# Patient Record
Sex: Female | Born: 1985 | Hispanic: Yes | Marital: Single | Smoking: Never smoker
Health system: Southern US, Community
[De-identification: ages and names within clinical notes are randomized; demographics above are authoritative.]

## PROBLEM LIST (undated history)

## (undated) HISTORY — PX: NO PAST SURGERIES: SHX2092

---

## 2017-05-31 DIAGNOSIS — E663 Overweight: Secondary | ICD-10-CM | POA: Insufficient documentation

## 2017-08-04 ENCOUNTER — Other Ambulatory Visit: Payer: Self-pay

## 2017-08-04 ENCOUNTER — Emergency Department: Payer: Self-pay

## 2017-08-04 ENCOUNTER — Encounter: Payer: Self-pay | Admitting: Emergency Medicine

## 2017-08-04 ENCOUNTER — Emergency Department
Admission: EM | Admit: 2017-08-04 | Discharge: 2017-08-05 | Disposition: A | Discharge status: Home / Self Care | Payer: Self-pay | Attending: Emergency Medicine | Admitting: Emergency Medicine

## 2017-08-04 DIAGNOSIS — O9989 Other specified diseases and conditions complicating pregnancy, childbirth and the puerperium: Secondary | ICD-10-CM | POA: Insufficient documentation

## 2017-08-04 DIAGNOSIS — R1030 Lower abdominal pain, unspecified: Secondary | ICD-10-CM | POA: Insufficient documentation

## 2017-08-04 DIAGNOSIS — O209 Hemorrhage in early pregnancy, unspecified: Secondary | ICD-10-CM

## 2017-08-04 DIAGNOSIS — O039 Complete or unspecified spontaneous abortion without complication: Secondary | ICD-10-CM | POA: Insufficient documentation

## 2017-08-04 DIAGNOSIS — Z3A13 13 weeks gestation of pregnancy: Secondary | ICD-10-CM | POA: Insufficient documentation

## 2017-08-04 DIAGNOSIS — O208 Other hemorrhage in early pregnancy: Secondary | ICD-10-CM

## 2017-08-04 DIAGNOSIS — N939 Abnormal uterine and vaginal bleeding, unspecified: Secondary | ICD-10-CM

## 2017-08-04 DIAGNOSIS — M545 Low back pain: Secondary | ICD-10-CM | POA: Insufficient documentation

## 2017-08-04 LAB — URINALYSIS, COMPLETE (UACMP) WITH MICROSCOPIC
BACTERIA UA: NONE SEEN
Bilirubin Urine: NEGATIVE
GLUCOSE, UA: NEGATIVE mg/dL
KETONES UR: NEGATIVE mg/dL
Nitrite: NEGATIVE
PROTEIN: NEGATIVE mg/dL
Specific Gravity, Urine: 1.026 (ref 1.005–1.030)
pH: 6 (ref 5.0–8.0)

## 2017-08-04 LAB — HCG, QUANTITATIVE, PREGNANCY: HCG, BETA CHAIN, QUANT, S: 5900 m[IU]/mL — AB (ref <5)

## 2017-08-04 LAB — CBC
HEMATOCRIT: 37.1 % (ref 35.0–47.0)
HEMOGLOBIN: 13.1 g/dL (ref 12.0–16.0)
MCH: 29.7 pg (ref 26.0–34.0)
MCHC: 35.2 g/dL (ref 32.0–36.0)
MCV: 84.3 fL (ref 80.0–100.0)
Platelets: 280 10*3/uL (ref 150–440)
RBC: 4.4 MIL/uL (ref 3.80–5.20)
RDW: 14.4 % (ref 11.5–14.5)
WBC: 8.7 10*3/uL (ref 3.6–11.0)

## 2017-08-04 LAB — WET PREP, GENITAL
CLUE CELLS WET PREP: NONE SEEN
Sperm: NONE SEEN
TRICH WET PREP: NONE SEEN
YEAST WET PREP: NONE SEEN

## 2017-08-04 LAB — ABO/RH: ABO/RH(D): A POS

## 2017-08-04 NOTE — ED Notes (Signed)
Patient transported to Ultrasound 

## 2017-08-04 NOTE — ED Notes (Signed)
MD at bedside with interpreter activated during examination.

## 2017-08-04 NOTE — ED Triage Notes (Signed)
[redacted] weeks pregnant, began abd pain and vaginal bleed this am.

## 2017-08-04 NOTE — ED Notes (Signed)
Pt to room 8 - interpreter on a stick in available when dr is ready to see pt.

## 2017-08-04 NOTE — ED Notes (Signed)
Patient back from US.

## 2017-08-05 ENCOUNTER — Encounter: Payer: Self-pay | Admitting: Emergency Medicine

## 2017-08-05 LAB — CHLAMYDIA/NGC RT PCR (ARMC ONLY)
Chlamydia Tr: NOT DETECTED
N gonorrhoeae: NOT DETECTED

## 2017-08-05 MED ORDER — ACETAMINOPHEN 325 MG PO TABS
650.0000 mg | ORAL_TABLET | Freq: Once | ORAL | Status: AC
  Administered 2017-08-05: 650 mg via ORAL
  Filled 2017-08-05: qty 2

## 2017-08-05 NOTE — ED Notes (Signed)
D/C and follow-up directions reviewed with pt with aid of tele-interpreter [place] [phone](#[phone])

## 2017-08-05 NOTE — ED Provider Notes (Signed)
[facility] Emergency Department Provider Note   ____________________________________________   First MD Initiated Contact with Patient 08/04/17 2304     (approximate)  I have reviewed the triage vital signs and the nursing notes.   HISTORY  Chief Complaint Vaginal Bleeding and Abdominal Pain    HPI Erica Rhodes is a 32 y.o. female who comes into the hospital today with some abdominal pain and bleeding.  The patient states that she started bleeding this morning.  She is [redacted] weeks pregnant.  She has not yet seen her OB/GYN.  The patient is scheduled to see her OB on Monday.  This is the patient's fifth pregnancy and she has 4 children so she is a G5 P4.  The patient states that she did have some spotting this morning but has continued bleeding throughout the day.  Her abdominal pain is around her waist lower abdomen and low back.  The patient rates her pain an 8 out of 10 in intensity.  The patient did not take anything for pain at home.  She has had no nausea or vomiting at this time.  She is here today for evaluation.  History reviewed. No pertinent past medical history.  There are no active problems to display for this patient.   History reviewed. No pertinent surgical history.  Prior to Admission medications   Not on File    Allergies Patient has no known allergies.  No family history on file.  Social History Social History   Tobacco Use  . Smoking status: Never Smoker  . Smokeless tobacco: Never Used  Substance Use Topics  . Alcohol use: Not Currently  . Drug use: Not on file    Review of Systems  Constitutional: No fever/chills Eyes: No visual changes. ENT: No sore throat. Cardiovascular: Denies chest pain. Respiratory: Denies shortness of breath. Gastrointestinal:  abdominal pain.  No nausea, no vomiting.  No diarrhea.  No constipation. Genitourinary: Vaginal bleeding Musculoskeletal: Negative for back pain. Skin:  Negative for rash. Neurological: Negative for headaches, focal weakness or numbness.   ____________________________________________   PHYSICAL EXAM:  VITAL SIGNS: ED Triage Vitals  Enc Vitals Group     BP 08/04/17 1543 130/75     Pulse Rate 08/04/17 1543 63     Resp 08/04/17 1543 18     Temp 08/04/17 1543 98.7 F (37.1 C)     Temp Source 08/04/17 1543 Oral     SpO2 08/04/17 1543 100 %     Weight 08/04/17 1545 159 lb (72.1 kg)     Height --      Head Circumference --      Peak Flow --      Pain Score 08/04/17 1544 7     Pain Loc --      Pain Edu? --      Excl. in GC? --     Constitutional: Alert and oriented. Well appearing and in moderate distress. Eyes: Conjunctivae are normal. PERRL. EOMI. Head: Atraumatic. Nose: No congestion/rhinnorhea. Mouth/Throat: Mucous membranes are moist.  Oropharynx non-erythematous. Cardiovascular: Normal rate, regular rhythm. Grossly normal heart sounds.  Good peripheral circulation. Respiratory: Normal respiratory effort.  No retractions. Lungs CTAB. Gastrointestinal: Soft with some tenderness to palpation in the lower abdomen. No distention.  Positive bowel sounds Genitourinary: Normal external genitalia with no blood in the vaginal vault mild uterine tenderness to palpation, cervix is closed, no cervical motion tenderness, no adnexal tenderness to palpation Musculoskeletal: No lower extremity tenderness nor edema.  Neurologic:  Normal speech and language.  Skin:  Skin is warm, dry and intact.  Psychiatric: Mood and affect are normal.   ____________________________________________   LABS (all labs ordered are listed, but only abnormal results are displayed)  Labs Reviewed  WET PREP, GENITAL - Abnormal; Notable for the following components:      Result Value   WBC, Wet Prep HPF POC MANY (*)    All other components within normal limits  URINALYSIS, COMPLETE (UACMP) WITH MICROSCOPIC - Abnormal; Notable for the following components:    Color, Urine YELLOW (*)    APPearance HAZY (*)    Hgb urine dipstick SMALL (*)    Leukocytes, UA TRACE (*)    Squamous Epithelial / LPF 6-30 (*)    All other components within normal limits  HCG, QUANTITATIVE, PREGNANCY - Abnormal; Notable for the following components:   hCG, Beta Chain, Quant, S 5,900 (*)    All other components within normal limits  CHLAMYDIA/NGC RT PCR ([facility] ONLY)  CBC  ABO/RH   ____________________________________________  EKG  none ____________________________________________  RADIOLOGY  ED MD interpretation:  US OB less than 14 weeks: Intrauterine gestational sac with no fetal pole concerning for failed pregnancy  Official radiology report(s): [place] Ob Comp Less 14 Wks  Result Date: 08/04/2017 CLINICAL DATA:  Pregnant patient in first-trimester pregnancy with vaginal bleeding. Beta HCG 5,900 EXAM: OBSTETRIC <14 WK US AND TRANSVAGINAL OB US TECHNIQUE: Both transabdominal and transvaginal ultrasound examinations were performed for complete evaluation of the gestation as well as the maternal uterus, adnexal regions, and pelvic cul-de-sac. Transvaginal technique was performed to assess early pregnancy. COMPARISON:  None. FINDINGS: Intrauterine gestational sac: Single Yolk sac:  Not Visualized. Embryo:  Not Visualized. Cardiac Activity: Not Visualized. MSD: 27 mm   7 w   5 d Subchorionic hemorrhage:  None visualized. Maternal uterus/adnexae: Intrauterine gestational sac contains a thin internal ovoid structure that measures 12 mm, no fetal pole. Both ovaries are visualized and are normal. No adnexal mass or pelvic free fluid. IMPRESSION: Intrauterine gestational sac with mean sac diameter of 27 mm. No fetal pole. Mean sac diameter of greater than 25 mm meets definitive criteria for failed pregnancy. This follows SRU consensus guidelines: Diagnostic Criteria for Nonviable Pregnancy Early in the First Trimester. Macy Mis Engl J Med [phone];369:1443-51. Electronically Signed    By: Rubye OaksMelanie  Ehinger M.D.   On: 08/04/2017 21:30   [place] Ob Transvaginal  Result Date: 08/04/2017 CLINICAL DATA:  Pregnant patient in first-trimester pregnancy with vaginal bleeding. Beta HCG 5,900 EXAM: OBSTETRIC <14 WK US AND TRANSVAGINAL OB US TECHNIQUE: Both transabdominal and transvaginal ultrasound examinations were performed for complete evaluation of the gestation as well as the maternal uterus, adnexal regions, and pelvic cul-de-sac. Transvaginal technique was performed to assess early pregnancy. COMPARISON:  None. FINDINGS: Intrauterine gestational sac: Single Yolk sac:  Not Visualized. Embryo:  Not Visualized. Cardiac Activity: Not Visualized. MSD: 27 mm   7 w   5 d Subchorionic hemorrhage:  None visualized. Maternal uterus/adnexae: Intrauterine gestational sac contains a thin internal ovoid structure that measures 12 mm, no fetal pole. Both ovaries are visualized and are normal. No adnexal mass or pelvic free fluid. IMPRESSION: Intrauterine gestational sac with mean sac diameter of 27 mm. No fetal pole. Mean sac diameter of greater than 25 mm meets definitive criteria for failed pregnancy. This follows SRU consensus guidelines: Diagnostic Criteria for Nonviable Pregnancy Early in the First Trimester. Macy Mis Engl J Med [phone];369:1443-51. Electronically Signed   By: Shawna OrleansMelanie  Ehinger M.D.   On: 08/04/2017 21:30    ____________________________________________   PROCEDURES  Procedure(s) performed: None  Procedures  Critical Care performed: No  ____________________________________________   INITIAL IMPRESSION / ASSESSMENT AND PLAN / ED COURSE  As part of my medical decision making, I reviewed the following data within the electronic MEDICAL RECORD NUMBER Notes from prior ED visits and [place] Controlled Substance Database   This is a 32 year old female who comes into the hospital today with some lower abdominal pain and vaginal bleeding at 13 weeks of pregnancy.  My differential diagnosis includes  miscarriage, subchorionic hemorrhage, ectopic pregnancy.  The patient did receive some blood work to include a CBC, quantitative beta hCG, ABO Rh, GC chlamydia and wet prep.  The patient's wet prep and chlamydia are negative.  The patient's quantitative beta hCG is 5900.  Her blood type is A+.  The patient's ultrasound showed an intrauterine gestational sac but there was no fetal pole with a concern for a failed pregnancy.  I discussed the results with the patient.  She did receive a dose of Tylenol for her pain.  The patient will be discharged home to follow-up with her OB/GYN as she has scheduled on Monday.  The patient has no other concerns at this time.      ____________________________________________   FINAL CLINICAL IMPRESSION(S) / ED DIAGNOSES  Final diagnoses:  Miscarriage  Vaginal bleeding     ED Discharge Orders    None       Note:  This document was prepared using Dragon voice recognition software and may include unintentional dictation errors.    Rebecka Apley, MD 08/05/17 [phone]

## 2017-08-08 ENCOUNTER — Encounter: Payer: Self-pay | Admitting: Obstetrics and Gynecology

## 2017-08-10 ENCOUNTER — Encounter: Payer: Self-pay | Admitting: Obstetrics and Gynecology

## 2017-08-10 ENCOUNTER — Ambulatory Visit (INDEPENDENT_AMBULATORY_CARE_PROVIDER_SITE_OTHER): Payer: Self-pay | Admitting: Obstetrics and Gynecology

## 2017-08-10 VITALS — BP 118/74 | Wt 158.0 lb

## 2017-08-10 DIAGNOSIS — O039 Complete or unspecified spontaneous abortion without complication: Secondary | ICD-10-CM

## 2017-08-10 NOTE — Progress Notes (Signed)
Obstetrics & Gynecology Office Visit   Chief Complaint  Patient presents with  . Follow-up    ER Follow Up   History of Present Illness: 32 y.o. she presents in follow up from an ER visit two days ago.  She knew that she was pregnant, but was concerned that she was bleeding. She was told that she was having a miscarriage.  She had a pelvic u/s that showed a gestational sac, no fetal pole. The gestational sac measured 27 mm.  Her quant hCG was 5,900. Her blood type is A+.  She has not stopped bleeding since Saturday. She has been passing large clots. She is using 4-5 pads per day.  She has cramping and pain.  Her last period was 05/02/2017.  Her menses come every month, lasting 1 week.  She went to the clinic in January to get a Depo Provera injection she had taken a home pregnancy test which was positive. However, in January, prior to receiving her Depo shot, she was told she was not pregnant. She was not on contraception in December.  Past Medical History: denies  Past Surgical History: denies   Gynecologic History: No LMP recorded. Patient is pregnant.  Obstetric History: [id number] G1 girl 2006 SVD G2 boy 2008 SVD G3 boy 2014 SVD G4 girl 2018 SVD G5 present  Family History: Denies history of gyn cancer    Social History   Socioeconomic History  . Marital status: Single    Spouse name: Not on file  . Number of children: Not on file  . Years of education: Not on file  . Highest education level: Not on file  Occupational History  . Not on file  Social Needs  . Financial resource strain: Not on file  . Food insecurity:    Worry: Not on file    Inability: Not on file  . Transportation needs:    Medical: Not on file    Non-medical: Not on file  Tobacco Use  . Smoking status: Never Smoker  . Smokeless tobacco: Never Used  Substance and Sexual Activity  . Alcohol use: Not Currently  . Drug use: Not on file  . Sexual activity: Not on file  Lifestyle  . Physical activity:      Days per week: Not on file    Minutes per session: Not on file  . Stress: Not on file  Relationships  . Social connections:    Talks on phone: Not on file    Gets together: Not on file    Attends religious service: Not on file    Active member of club or organization: Not on file    Attends meetings of clubs or organizations: Not on file    Relationship status: Not on file  . Intimate partner violence:    Fear of current or ex partner: Not on file    Emotionally abused: Not on file    Physically abused: Not on file    Forced sexual activity: Not on file  Other Topics Concern  . Not on file  Social History Narrative  . Not on file   Allergies: No Known Allergies  Prior to Admission medications   Denies   Review of Systems  Constitutional: Negative.   HENT: Negative.   Eyes: Negative.   Respiratory: Negative.   Cardiovascular: Negative.   Gastrointestinal: Positive for abdominal pain (cramping with bleeding). Negative for blood in stool, constipation, diarrhea, heartburn, melena, nausea and vomiting.  Genitourinary: Negative.  See hpi  Musculoskeletal: Negative.   Skin: Negative.   Neurological: Negative.   Psychiatric/Behavioral: Negative.      Physical Exam BP 118/74   Wt 158 lb (71.7 kg)  No LMP recorded. Patient is pregnant. Physical Exam  Constitutional: She is oriented to person, place, and time. She appears well-developed and well-nourished. No distress.  Genitourinary: Vagina normal and uterus normal. Pelvic exam was performed with patient supine. There is no rash, tenderness or lesion on the right labia. There is no rash, tenderness or lesion on the left labia. No bleeding in the vagina. No signs of injury around the vagina. Right adnexum does not display mass, does not display tenderness and does not display fullness. Left adnexum does not display mass, does not display tenderness and does not display fullness. Cervix does not exhibit motion tenderness,  lesion or polyp.   Uterus is mobile. Uterus is not enlarged, tender, exhibiting a mass or irregular (is regular).  Genitourinary Comments: Cervical OS is closed. Scant bleeding.  HENT:  Head: Normocephalic and atraumatic.  Eyes: EOM are normal. No scleral icterus.  Neck: Normal range of motion. Neck supple. No thyromegaly present.  Cardiovascular: Normal rate and regular rhythm. Exam reveals no gallop and no friction rub.  No murmur heard. Pulmonary/Chest: Effort normal and breath sounds normal. No respiratory distress. She has no wheezes. She has no rales.  Abdominal: Soft. Bowel sounds are normal. She exhibits no distension and no mass. There is no tenderness. There is no rebound and no guarding.  Musculoskeletal: Normal range of motion. She exhibits no edema.  Lymphadenopathy:    She has no cervical adenopathy.  Neurological: She is alert and oriented to person, place, and time. No cranial nerve deficit.  Skin: Skin is warm and dry. No erythema.  Psychiatric: She has a normal mood and affect. Her behavior is normal. Judgment normal.   Female chaperone present for pelvic and breast  portions of the physical exam  Assessment: 32 y.o. G1P0 female here for  1. Spontaneous abortion      Plan: Problem List Items Addressed This Visit    None    Visit Diagnoses    Spontaneous abortion    -  Primary   Relevant Orders   Beta hCG quant (ref lab)     Condolences were offered to the patient and her family.  I stressed that while emotionally difficult, that this did not occur because of an actions or inactions by the patient.  Somewhere between 10-20% of identified first trimester pregnancies will unfortunately end in miscarriage.  Given this relatively high incidence rate, further diagnostic testing such as chromosome analysis is generally not clinically relevant nor recommended.  Although the chromosomal abnormalities have been implicated at rates as high as 70% in some studies, these are  generally random and do not infer and increased risk of recurrence with subsequent pregnancies.  However, 3 or more consecutive first trimester losses are relatively uncommon, and these patient generally do benefit from additional work up to determine a potential modifiable etiology.   We briefly discussed management options including expectant management, medical management, and surgical management as well as their relative success rates and complications. Approximately 80% of first trimester miscarriages will pass successfully but may require a time frame of up to 8 weeks (ACOG Practice Bulletin 150 May 2015 "Early Pregnancy Loss").  Medical management using of misoprostil administered every 3-hrs as needed for up to 3 doses speeds up the time frame to completion significantly,  has literature supporting its use up to 63 days or 1835w0d gestation and results in a passage rate of 84-85% Environmental education officer(ACOG Practice Bulletin 143 March 2014 "Medical Management of First-Trimester Abortion").  Dilation and curettage has the highest rate of uterine evacuation, but carries with is operative cost, surgical and anesthetic risk.  While these risk are relatively small they nevertheless include infection, bleeding, uterine perforation, formation of uterine synechia, and in rare cases death.   We discussed repeat ultrasound and or trending HCG levels if the patient wishes to pursue these prior to making her decision.  Clinically I am confident of the diagnosis, but I do not want any doubts in the patient's mind regarding the plan of management she chooses to adopt.  I will allow the patient and her family to discuss management options and she was advised to contact the office to arrange final disposition one she has made her decision or should she have any follow up questions for myself.    The patient is Rh + and rhogam is therefore not indicated.   Will start by trending her hCG values. If rising, will repeat pelvic ultrasound. If  decreasing, will follow.  Discussed that too much bleeding is bleeding > 2 pads per hour for > 2 hours.  She voiced understanding of this.  30 minutes spent in face to face discussion with > 50% spent in counseling,management, and coordination of care of her spontaneous abortion.  A [facility] provided medical spanish interpreter was present throughout the entire visit.  Thomasene MohairStephen Makisha Marrin, MD 08/10/2017 5:21 PM

## 2017-08-11 LAB — BETA HCG QUANT (REF LAB): HCG QUANT: 277 m[IU]/mL

## 2017-08-14 ENCOUNTER — Telehealth: Payer: Self-pay | Admitting: Obstetrics and Gynecology

## 2017-08-14 NOTE — Telephone Encounter (Signed)
Patient is calling for labs results. Please advise. Pt needs an interpreter

## 2017-08-16 ENCOUNTER — Other Ambulatory Visit: Payer: Self-pay | Admitting: Obstetrics and Gynecology

## 2017-08-16 ENCOUNTER — Other Ambulatory Visit: Payer: Self-pay

## 2017-08-16 DIAGNOSIS — O039 Complete or unspecified spontaneous abortion without complication: Secondary | ICD-10-CM

## 2017-08-16 NOTE — Telephone Encounter (Signed)
Spoke w patient in office, as she stopped by. Discussed significant decrease in HCG. Will have her take home pregnancy tests until negative. She would like a Mirena IUD. Will have her call to schedule when her test is negative.

## 2017-10-24 ENCOUNTER — Other Ambulatory Visit: Payer: Self-pay

## 2017-10-24 ENCOUNTER — Emergency Department
Admission: EM | Admit: 2017-10-24 | Discharge: 2017-10-24 | Disposition: A | Discharge status: Home / Self Care | Payer: Self-pay | Attending: Emergency Medicine | Admitting: Emergency Medicine

## 2017-10-24 ENCOUNTER — Emergency Department: Payer: Self-pay

## 2017-10-24 DIAGNOSIS — R109 Unspecified abdominal pain: Secondary | ICD-10-CM

## 2017-10-24 DIAGNOSIS — R1012 Left upper quadrant pain: Secondary | ICD-10-CM | POA: Insufficient documentation

## 2017-10-24 DIAGNOSIS — R197 Diarrhea, unspecified: Secondary | ICD-10-CM | POA: Insufficient documentation

## 2017-10-24 LAB — URINALYSIS, COMPLETE (UACMP) WITH MICROSCOPIC
Bilirubin Urine: NEGATIVE
GLUCOSE, UA: NEGATIVE mg/dL
Hgb urine dipstick: NEGATIVE
Ketones, ur: 5 mg/dL — AB
Leukocytes, UA: NEGATIVE
Nitrite: NEGATIVE
PH: 5 (ref 5.0–8.0)
PROTEIN: 30 mg/dL — AB
Specific Gravity, Urine: 1.031 — ABNORMAL HIGH (ref 1.005–1.030)

## 2017-10-24 LAB — COMPREHENSIVE METABOLIC PANEL
ALK PHOS: 53 U/L (ref 38–126)
ALT: 28 U/L (ref 14–54)
AST: 43 U/L — ABNORMAL HIGH (ref 15–41)
Albumin: 4.1 g/dL (ref 3.5–5.0)
Anion gap: 10 (ref 5–15)
BUN: 11 mg/dL (ref 6–20)
CALCIUM: 8.5 mg/dL — AB (ref 8.9–10.3)
CO2: 22 mmol/L (ref 22–32)
CREATININE: 0.52 mg/dL (ref 0.44–1.00)
Chloride: 108 mmol/L (ref 101–111)
GFR calc non Af Amer: >60 mL/min (ref >60)
GLUCOSE: 122 mg/dL — AB (ref 65–99)
Potassium: 3.1 mmol/L — ABNORMAL LOW (ref 3.5–5.1)
SODIUM: 140 mmol/L (ref 135–145)
Total Bilirubin: 0.5 mg/dL (ref 0.3–1.2)
Total Protein: 7.6 g/dL (ref 6.5–8.1)

## 2017-10-24 LAB — CBC
HCT: 36.2 % (ref 35.0–47.0)
Hemoglobin: 12.5 g/dL (ref 12.0–16.0)
MCH: 28.4 pg (ref 26.0–34.0)
MCHC: 34.6 g/dL (ref 32.0–36.0)
MCV: 82.1 fL (ref 80.0–100.0)
PLATELETS: 273 10*3/uL (ref 150–440)
RBC: 4.41 MIL/uL (ref 3.80–5.20)
RDW: 14.9 % — ABNORMAL HIGH (ref 11.5–14.5)
WBC: 8.8 10*3/uL (ref 3.6–11.0)

## 2017-10-24 LAB — POCT PREGNANCY, URINE: Preg Test, Ur: NEGATIVE

## 2017-10-24 LAB — LIPASE, BLOOD: Lipase: 47 U/L (ref 11–51)

## 2017-10-24 MED ORDER — DIPHENOXYLATE-ATROPINE 2.5-0.025 MG PO TABS: 1.0000 | ORAL_TABLET | Freq: Four times a day (QID) | ORAL | 0 refills | Status: AC | PRN

## 2017-10-24 NOTE — ED Notes (Signed)
Pt c/o left sided abd pain for the past week , about 6 watery stools/day for the past 4 days. Denies N/V..Marland Kitchen

## 2017-10-24 NOTE — ED Notes (Signed)
Interpreter paged.

## 2017-10-24 NOTE — ED Triage Notes (Signed)
Pt reports that she is having LLQ pain, it has been going on for the last week and each day it is getting worse. Pt reports that she has had diarrhea. Denies N/V

## 2017-10-24 NOTE — ED Notes (Signed)
Waiting on interpreter at this time. Pt is aware.

## 2017-10-24 NOTE — ED Provider Notes (Signed)
[facility] Emergency Department Provider Note ____________________________________________   First MD Initiated Contact with Patient 10/24/17 1621     (approximate)  I have reviewed the triage vital signs and the nursing notes.   HISTORY  Chief Complaint Abdominal Pain  HPI Erica Rhodes is a 32 y.o. female with a history of a miscarriage approximately 3 months ago has been with left-sided intermittent flank pain over the past week.  The pain is been worsening and is sharp and severe and intermittent.  The patient says that the pain comes about every 15 minutes.  Not associated with any nausea or vomiting.  However, the patient says that she has had diarrhea about 6 times over the past 4 days.  No known sick contacts.  No recent antibiotic usage.  Denies any vaginal bleeding or discharge.  Low concern for sexually transmitted disease.  No past medical history on file.  There are no active problems to display for this patient.   Past Surgical History:  Procedure Laterality Date  . NO PAST SURGERIES      Prior to Admission medications   Not on File    Allergies Patient has no known allergies.  No family history on file.  Social History Social History   Tobacco Use  . Smoking status: Never Smoker  . Smokeless tobacco: Never Used  Substance Use Topics  . Alcohol use: Not Currently  . Drug use: Never    Review of Systems  Constitutional: No fever/chills Eyes: No visual changes. ENT: No sore throat. Cardiovascular: Denies chest pain. Respiratory: Denies shortness of breath. Gastrointestinal:   No nausea, no vomiting. No constipation. Genitourinary: Negative for dysuria. Musculoskeletal: Negative for back pain. Skin: Negative for rash. Neurological: Negative for headaches, focal weakness or numbness.   ____________________________________________   PHYSICAL EXAM:  VITAL SIGNS: ED Triage Vitals  Enc Vitals Group     BP  10/24/17 1547 125/62     Pulse Rate 10/24/17 1547 84     Resp 10/24/17 1547 18     Temp 10/24/17 1547 98.3 F (36.8 C)     Temp Source 10/24/17 1547 Oral     SpO2 10/24/17 1547 99 %     Weight 10/24/17 1546 167 lb (75.8 kg)     Height 10/24/17 1546 5\' 4"  (1.626 m)     Head Circumference --      Peak Flow --      Pain Score 10/24/17 1544 10     Pain Loc --      Pain Edu? --      Excl. in GC? --     Constitutional: Alert and oriented. Well appearing and in no acute distress. Eyes: Conjunctivae are normal.  Head: Atraumatic. Nose: No congestion/rhinnorhea. Mouth/Throat: Mucous membranes are moist.  Neck: No stridor.   Cardiovascular: Normal rate, regular rhythm. Grossly normal heart sounds.   Respiratory: Normal respiratory effort.  No retractions. Lungs CTAB. Gastrointestinal: Soft with moderate tenderness palpation in the left upper as well as left lower quadrants.  No rebound or guarding. No distention. No CVA tenderness. Musculoskeletal: No lower extremity tenderness nor edema.  No joint effusions. Neurologic:  Normal speech and language. No gross focal neurologic deficits are appreciated. Skin:  Skin is warm, dry and intact. No rash noted. Psychiatric: Mood and affect are normal. Speech and behavior are normal.  ____________________________________________   LABS (all labs ordered are listed, but only abnormal results are displayed)  Labs Reviewed  COMPREHENSIVE METABOLIC PANEL - Abnormal;  Notable for the following components:      Result Value   Potassium 3.1 (*)    Glucose, Bld 122 (*)    Calcium 8.5 (*)    AST 43 (*)    All other components within normal limits  CBC - Abnormal; Notable for the following components:   RDW 14.9 (*)    All other components within normal limits  URINALYSIS, COMPLETE (UACMP) WITH MICROSCOPIC - Abnormal; Notable for the following components:   Color, Urine YELLOW (*)    APPearance HAZY (*)    Specific Gravity, Urine 1.031 (*)     Ketones, ur 5 (*)    Protein, ur 30 (*)    Bacteria, UA RARE (*)    All other components within normal limits  LIPASE, BLOOD  POC URINE PREG, ED  POCT PREGNANCY, URINE   ____________________________________________  EKG   ____________________________________________  RADIOLOGY  CT abdomen without any acute intra-abdominal process.  Cholelithiasis but patient without any right upper quadrant tenderness to palpation. ____________________________________________   PROCEDURES  Procedure(s) performed:   Procedures  Critical Care performed:   ____________________________________________   INITIAL IMPRESSION / ASSESSMENT AND PLAN / ED COURSE  Pertinent labs & imaging results that were available during my care of the patient were reviewed by me and considered in my medical decision making (see chart for details).  Differential diagnosis includes, but is not limited to, ovarian cyst, ovarian torsion, acute appendicitis, diverticulitis, urinary tract infection/pyelonephritis, endometriosis, bowel obstruction, colitis, renal colic, gastroenteritis, hernia, fibroids, endometriosis, pregnancy related pain including ectopic pregnancy, etc. As part of my medical decision making, I reviewed the following data within the electronic MEDICAL RECORD NUMBER Notes from prior ED visits  ----------------------------------------- 7:44 PM on 10/24/2017 -----------------------------------------  Patient without any distress at this time.  Pain likely secondary to diarrhea.  Informed about gallstones.  Patient will be following up with her primary care.  We will give Lomotil for symptomatic relief. ____________________________________________   FINAL CLINICAL IMPRESSION(S) / ED DIAGNOSES  Abdominal pain.  Diarrhea.    NEW MEDICATIONS STARTED DURING THIS VISIT:  New Prescriptions   No medications on file     Note:  This document was prepared using Dragon voice recognition software and may  include unintentional dictation errors.     Myrna BlazerSchaevitz, David Matthew, MD 10/24/17 1944

## 2017-10-24 NOTE — ED Notes (Signed)
Awaiting interpreter

## 2018-07-10 LAB — HM HIV SCREENING LAB: HM HIV Screening: NEGATIVE

## 2018-12-16 DIAGNOSIS — E663 Overweight: Secondary | ICD-10-CM

## 2018-12-17 ENCOUNTER — Other Ambulatory Visit: Payer: Self-pay

## 2018-12-17 ENCOUNTER — Ambulatory Visit (LOCAL_COMMUNITY_HEALTH_CENTER): Payer: Self-pay

## 2018-12-17 VITALS — BP 119/70 | Ht 62.0 in | Wt 179.5 lb

## 2018-12-17 DIAGNOSIS — Z3042 Encounter for surveillance of injectable contraceptive: Secondary | ICD-10-CM

## 2018-12-17 DIAGNOSIS — Z3009 Encounter for other general counseling and advice on contraception: Secondary | ICD-10-CM

## 2018-12-17 DIAGNOSIS — Z30013 Encounter for initial prescription of injectable contraceptive: Secondary | ICD-10-CM

## 2018-12-17 MED ORDER — MEDROXYPROGESTERONE ACETATE 150 MG/ML IM SUSP
150.0000 mg | Freq: Once | INTRAMUSCULAR | Status: AC
  Administered 2018-12-17: 150 mg via INTRAMUSCULAR

## 2018-12-17 NOTE — Progress Notes (Signed)
Last physical at [facility] 07/10/2018. Last depo at [facility] 09/30/2018; 11.1 weeks post depo. DMPA 150 mg IM today per Antoine Primas, PA order dated 07/10/2018.

## 2019-03-10 ENCOUNTER — Ambulatory Visit: Payer: Self-pay

## 2019-03-18 ENCOUNTER — Ambulatory Visit (LOCAL_COMMUNITY_HEALTH_CENTER): Payer: Self-pay

## 2019-03-18 ENCOUNTER — Other Ambulatory Visit: Payer: Self-pay

## 2019-03-18 VITALS — BP 117/71 | Ht 62.0 in | Wt 187.5 lb

## 2019-03-18 DIAGNOSIS — Z3009 Encounter for other general counseling and advice on contraception: Secondary | ICD-10-CM

## 2019-03-18 DIAGNOSIS — Z30013 Encounter for initial prescription of injectable contraceptive: Secondary | ICD-10-CM

## 2019-03-18 MED ORDER — MULTI-VITAMIN/MINERALS PO TABS: 1.0000 | ORAL_TABLET | Freq: Every day | ORAL | 0 refills | Status: AC

## 2019-03-18 MED ORDER — MEDROXYPROGESTERONE ACETATE 150 MG/ML IM SUSP
150.0000 mg | Freq: Once | INTRAMUSCULAR | Status: AC
  Administered 2019-03-18: 10:00:00 150 mg via INTRAMUSCULAR

## 2019-03-18 MED ORDER — MULTI-VITAMIN/MINERALS PO TABS: 1.0000 | ORAL_TABLET | Freq: Every day | ORAL | 0 refills | Status: DC

## 2019-03-18 NOTE — Progress Notes (Addendum)
Client requested Depo "in right hip". Depo administered per 07/10/2018 written order of Antoine Primas PA and client tolerated without complaint. Rich Number, RN Folic acid counseling completed and vitamins dispensed. Rich Number, RN

## 2019-07-31 ENCOUNTER — Encounter: Payer: Self-pay | Admitting: Family Medicine

## 2019-07-31 ENCOUNTER — Ambulatory Visit (LOCAL_COMMUNITY_HEALTH_CENTER): Payer: Self-pay | Admitting: Family Medicine

## 2019-07-31 ENCOUNTER — Other Ambulatory Visit: Payer: Self-pay

## 2019-07-31 VITALS — BP 109/69 | Ht 62.0 in | Wt 189.4 lb

## 2019-07-31 DIAGNOSIS — Z113 Encounter for screening for infections with a predominantly sexual mode of transmission: Secondary | ICD-10-CM

## 2019-07-31 DIAGNOSIS — Z3009 Encounter for other general counseling and advice on contraception: Secondary | ICD-10-CM

## 2019-07-31 DIAGNOSIS — N912 Amenorrhea, unspecified: Secondary | ICD-10-CM

## 2019-07-31 DIAGNOSIS — Z30013 Encounter for initial prescription of injectable contraceptive: Secondary | ICD-10-CM

## 2019-07-31 DIAGNOSIS — Z01419 Encounter for gynecological examination (general) (routine) without abnormal findings: Secondary | ICD-10-CM

## 2019-07-31 LAB — WET PREP FOR TRICH, YEAST, CLUE
Trichomonas Exam: NEGATIVE
Yeast Exam: NEGATIVE

## 2019-07-31 LAB — PREGNANCY, URINE: Preg Test, Ur: NEGATIVE

## 2019-07-31 MED ORDER — MEDROXYPROGESTERONE ACETATE 150 MG/ML IM SUSP
150.0000 mg | INTRAMUSCULAR | Status: AC
  Administered 2019-07-31 – 2020-05-27 (×4): 150 mg via INTRAMUSCULAR

## 2019-07-31 NOTE — Progress Notes (Signed)
Family Planning Visit- Repeat Yearly Visit  Subjective:  Erica Rhodes is a 34 y.o. being seen today for an well woman visit and to discuss family planning options.    She is currently using Depo-Provera injections for pregnancy prevention. Patient reports she does not if she or her partner wants a pregnancy in the next year. Patient  has Overweight on their problem list.  Chief Complaint  Patient presents with  . Contraception    Patient reports late depo. Last sex was 3d ago and the time prior was > 2 wks. Took UPT at home that was negative.   Patient denies vaginal sx today  Does the patient desire a pregnancy in the next year? ([facility] flowsheet)  See flowsheet for other program required questions.   Body mass index is 34.64 kg/m. - Patient is eligible for diabetes screening based on BMI and age >45?  no HA1C ordered? not applicable  Patient reports 1 of partners in last year. Desires STI screening?  Yes  Does the patient have a current or past history of drug use? No   No components found for: HCV]   Health Maintenance Due  Topic Date Due  . INFLUENZA VACCINE  Never done    Review of Systems  Constitutional: Negative for chills and fever.  Eyes: Negative for blurred vision and double vision.  Respiratory: Negative for cough and shortness of breath.   Cardiovascular: Negative for chest pain and orthopnea.  Gastrointestinal: Negative for nausea and vomiting.  Genitourinary: Negative for dysuria, flank pain and frequency.  Musculoskeletal: Negative for myalgias.  Skin: Negative for rash.  Neurological: Negative for dizziness, tingling, weakness and headaches.  Endo/Heme/Allergies: Does not bruise/bleed easily.  Psychiatric/Behavioral: Negative for depression and suicidal ideas. The patient is not nervous/anxious.     The following portions of the patient's history were reviewed and updated as appropriate: allergies, current medications, past family history, past  medical history, past social history, past surgical history and problem list. Problem list updated.  Objective:   Vitals:   07/31/19 1032  BP: 109/69  Weight: 189 lb 6.4 oz (85.9 kg)  Height: 5\' 2"  (1.575 m)    Physical Exam Constitutional:      Appearance: Normal appearance.  HENT:     Head: Normocephalic and atraumatic.  Pulmonary:     Effort: Pulmonary effort is normal.  Abdominal:     Palpations: Abdomen is soft.  Musculoskeletal:        General: Normal range of motion.  Skin:    General: Skin is warm and dry.  Neurological:     General: No focal deficit present.     Mental Status: She is alert.  Psychiatric:        Mood and Affect: Mood normal.        Behavior: Behavior normal.       Assessment and Plan:  Erica Rhodes is a 34 y.o. female presenting to the [facility] for an initial well woman exam/family planning visit  Contraception counseling: Reviewed all forms of birth control options in the tiered based approach. available including abstinence; over the counter/barrier methods; hormonal contraceptive medication including pill, patch, ring, injection,contraceptive implant; hormonal and nonhormonal IUDs; permanent sterilization options including vasectomy and the various tubal sterilization modalities. Risks, benefits, and typical effectiveness rates were reviewed.  Questions were answered.  Written information was also given to the patient to review.  Patient desires depo, this was prescribed for patient. She will follow up in  93yr for  surveillance.  She was told to call with any further questions, or with any concerns about this method of contraception.  Emphasized use of condoms 100% of the time for STI prevention.  Patient was offered ECP. ECP was not accepted by the patient.    1. Screening examination for venereal disease Patient accepted all screenings including vaginal CT/GC and bloodwork for HIV/RPR.   Treat wet prep per  standing order Discussed time line for [facility] results and that patient will be called with positive results and encouraged patient to call if she had not heard in 2 weeks.  Counseled to return or seek care for continued or worsening symptoms Recommended condom use with all sex  Patient is currently using depo to prevent pregnancy.  - Pregnancy, urine - Chlamydia/Gonorrhea [place] [facility] - HIV [place] [facility] - Syphilis Serology, [place] [facility] - WET PREP FOR [place], YEAST, CLUE  2. Amenorrhea Urine pregnancy tday  3. Encounter for initial prescription of injectable contraceptive- LATE DEPO  Presenting with late depo (19wk). Currently ~3d from unprotected sex. Reviewed risk of pregnancy. Does not not ECP. Patient desire to get depo today and will take home UPT in 2 wks. Patient will need UPT at next depo administration.  - Depo x 1 year prescribed today   Return in about 3 months (around 10/31/2019) for Depo and UPT prior to administration.  No future appointments.  Caren Macadam, MD

## 2019-07-31 NOTE — Progress Notes (Addendum)
Here today for Depo. Last PE here was 07/10/2018. Per [facility] next Pap not due until 2023 (no records.) Last Depo was 03/18/2019 (19.2 weeks.) Requests PT and STD screening including bloodwork. Tawny Hopping, RN

## 2019-10-14 ENCOUNTER — Ambulatory Visit: Payer: Self-pay

## 2019-11-24 ENCOUNTER — Ambulatory Visit: Payer: Self-pay

## 2019-11-27 ENCOUNTER — Other Ambulatory Visit: Payer: Self-pay

## 2019-11-27 ENCOUNTER — Ambulatory Visit (LOCAL_COMMUNITY_HEALTH_CENTER): Payer: Self-pay | Admitting: Physician Assistant

## 2019-11-27 ENCOUNTER — Encounter: Payer: Self-pay | Admitting: Physician Assistant

## 2019-11-27 VITALS — BP 124/80 | Ht 63.0 in | Wt 183.0 lb

## 2019-11-27 DIAGNOSIS — Z30013 Encounter for initial prescription of injectable contraceptive: Secondary | ICD-10-CM

## 2019-11-27 DIAGNOSIS — Z3009 Encounter for other general counseling and advice on contraception: Secondary | ICD-10-CM

## 2019-11-27 LAB — PREGNANCY, URINE: Preg Test, Ur: NEGATIVE

## 2019-11-27 NOTE — Progress Notes (Signed)
UPT negative. Per C. Rolley Sims, ok to give Depo today. Depo given per Dr. Alvester Morin orders for DMPA x 1 year written 07/31/2019. Patient tolerated well, next Depo card given. Patient counseled needs Depo every 11-13 weeks.Burt Knack, RN

## 2019-11-27 NOTE — Progress Notes (Signed)
Patient here for Depo at 17 weeks. Per notes from last visit on 07/31/19, Dr. Alvester Morin gave orders for Depo x 1year and notes state that patient needs urine PT at next Depo appointment. Next PE due 07/2020.Marland KitchenBurt Knack, RN

## 2019-11-30 NOTE — Progress Notes (Signed)
Consulted by RN re:  Patient here at 17 weeks since last Depo and Dr. Alvester Morin order for Depo for 1 year on 07/31/2019.  As long as PT is negative, ok to give Depo per above order and would recommend that patient use condoms as back up for 10-14 days after shot today.

## 2020-02-28 IMAGING — CT CT RENAL STONE PROTOCOL
3 of 4 series · 9 of 46 positions shown, 16 images · non-contrast
Comparison: None.

CLINICAL DATA: Left lower quadrant pain for the past week.

EXAM:
CT ABDOMEN AND PELVIS WITHOUT CONTRAST
TECHNIQUE: Multidetector CT imaging of the abdomen and pelvis was performed
following the standard protocol without IV contrast.

[Series 4: lung bases · axial · 0.80mm/px · z∈[-134,-49]mm · 5 of 27 slices shown, 10 images]
[im 5/27  soft-tissue]
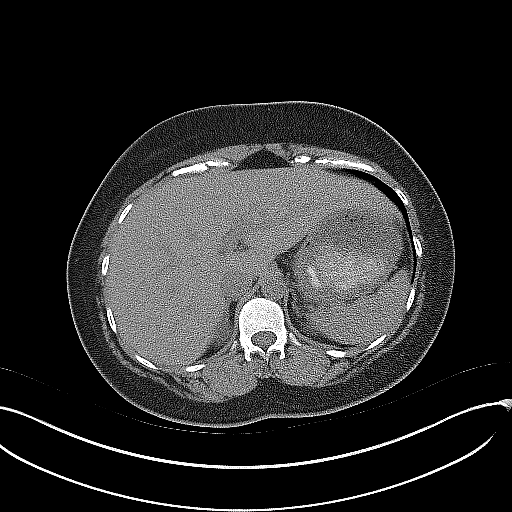
[im 5/27  bone]
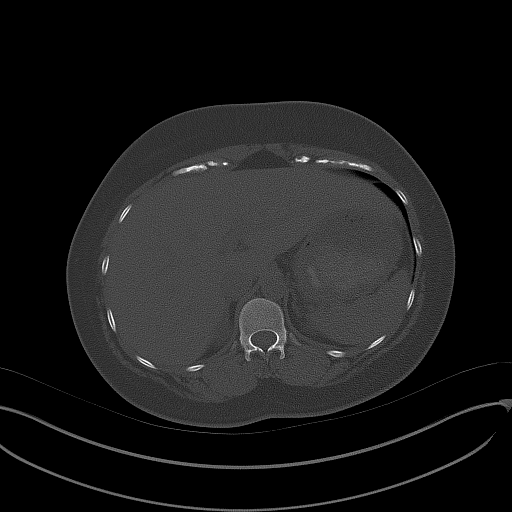
[im 9/27  soft-tissue]
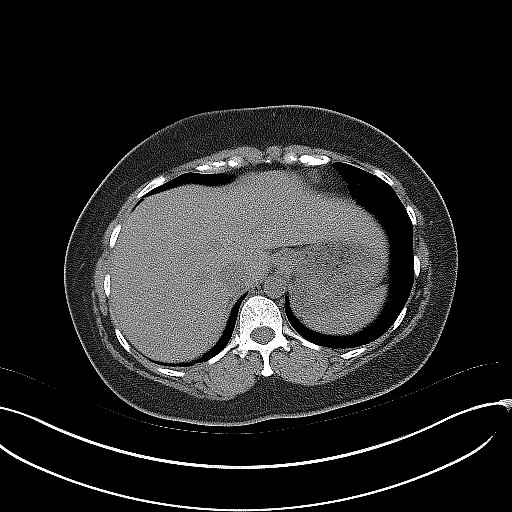
[im 9/27  lung]
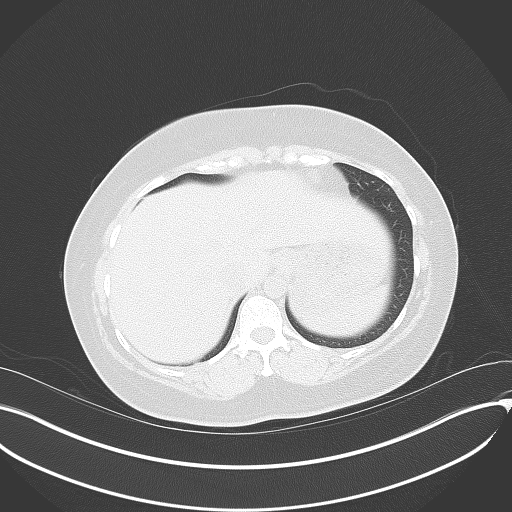
[im 14/27  soft-tissue]
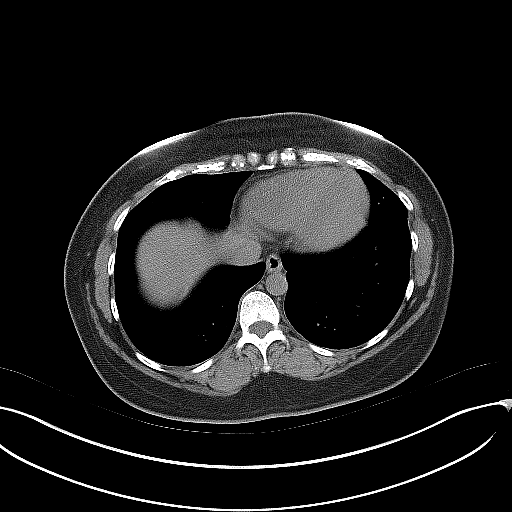
[im 14/27  lung]
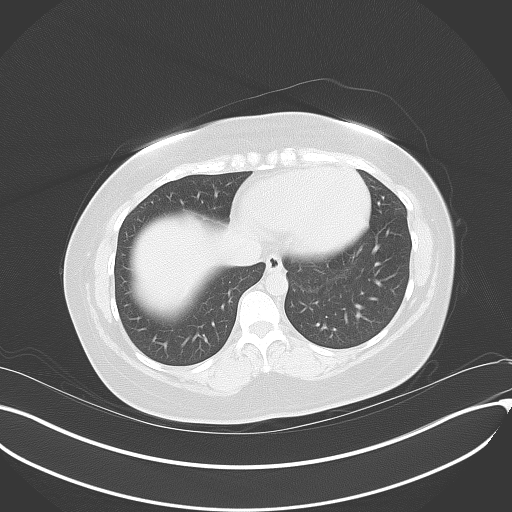
[im 18/27  soft-tissue]
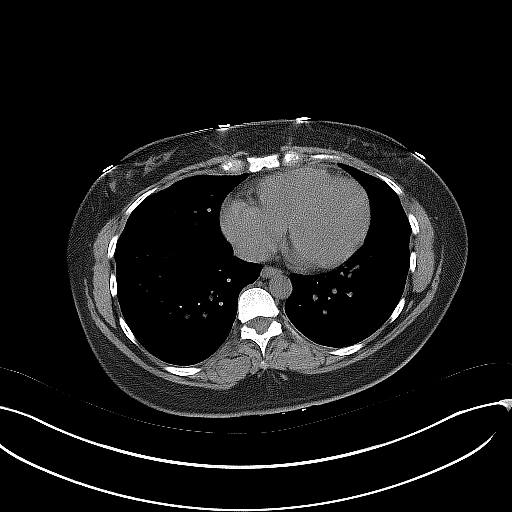
[im 18/27  lung]
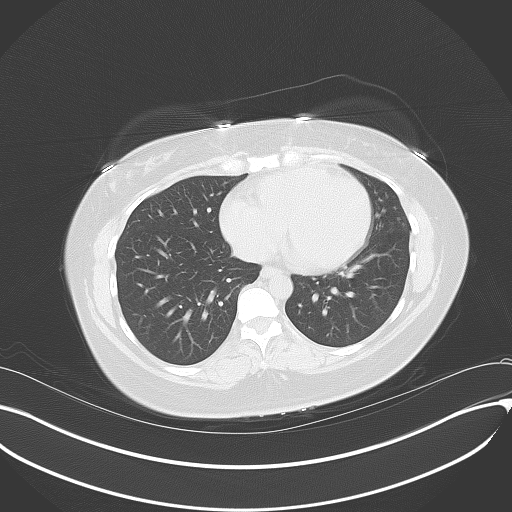
[im 22/27  soft-tissue]
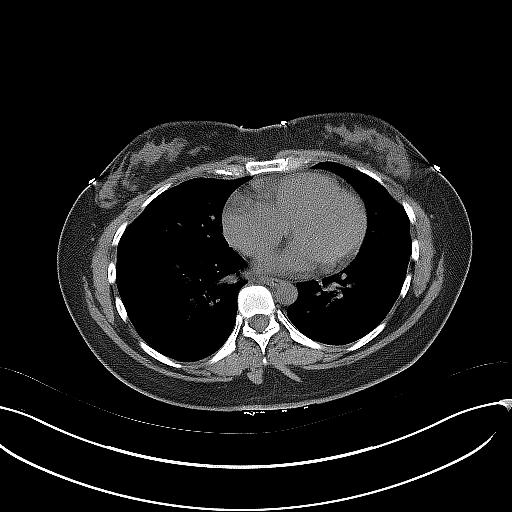
[im 22/27  lung]
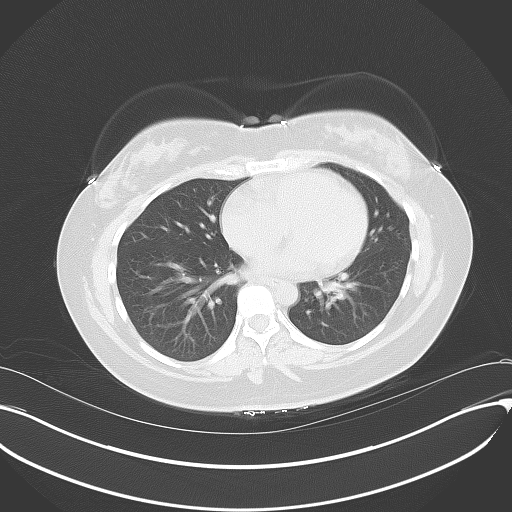

[Series 5: coronal · coronal · 0.82mm/px · 3 of 137 slices shown, 4 images]
[im 46/137  soft-tissue]
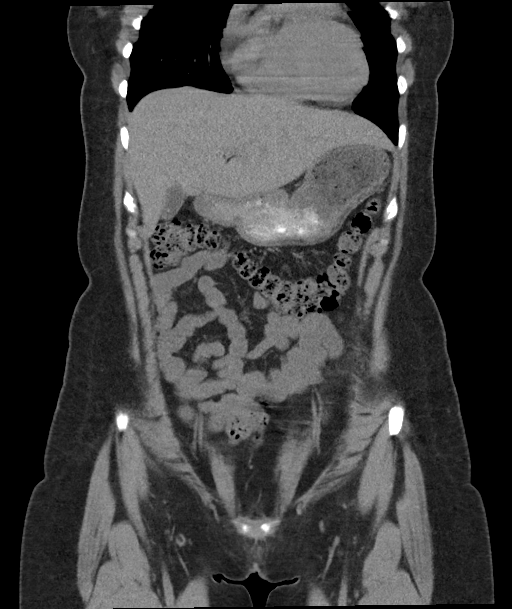
[im 61/137  soft-tissue]
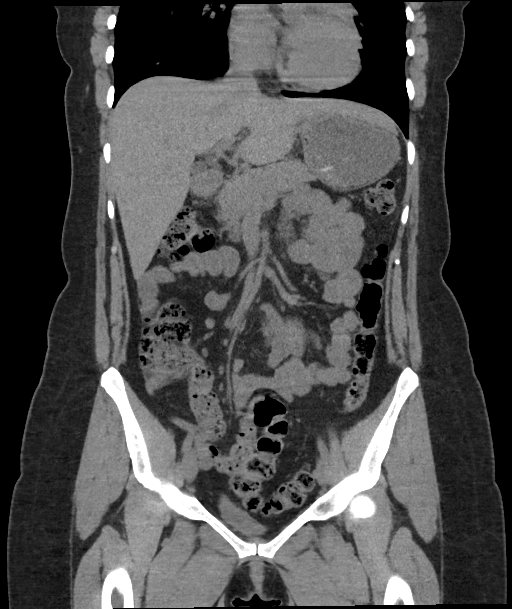
[im 61/137  bone]
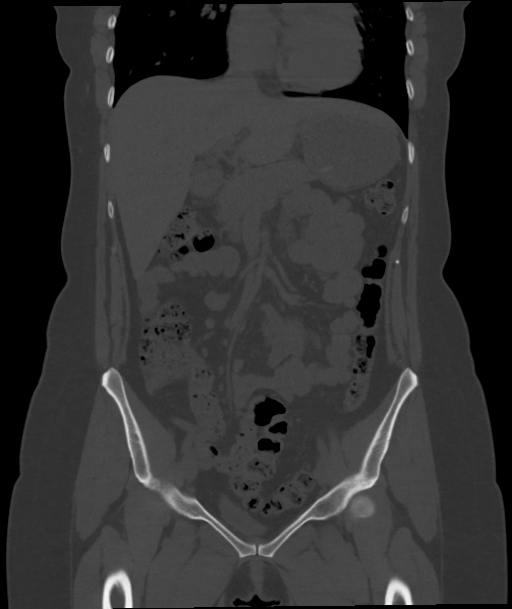
[im 76/137  soft-tissue]
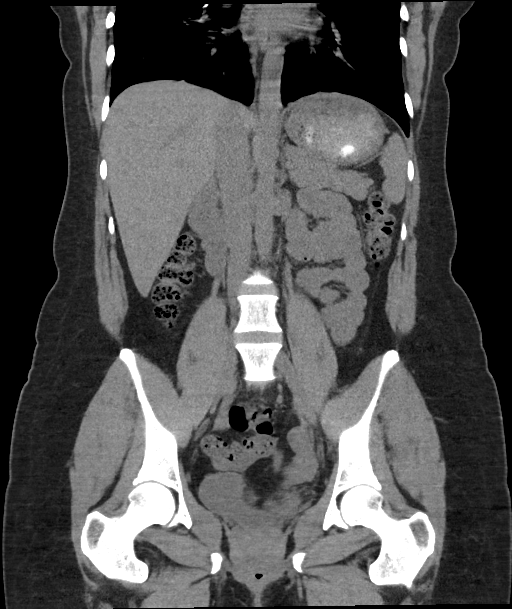

[Series 6: sagittal · sagittal · 0.54mm/px · 1 of 210 slices shown, 2 images]
[im 70/210  soft-tissue]
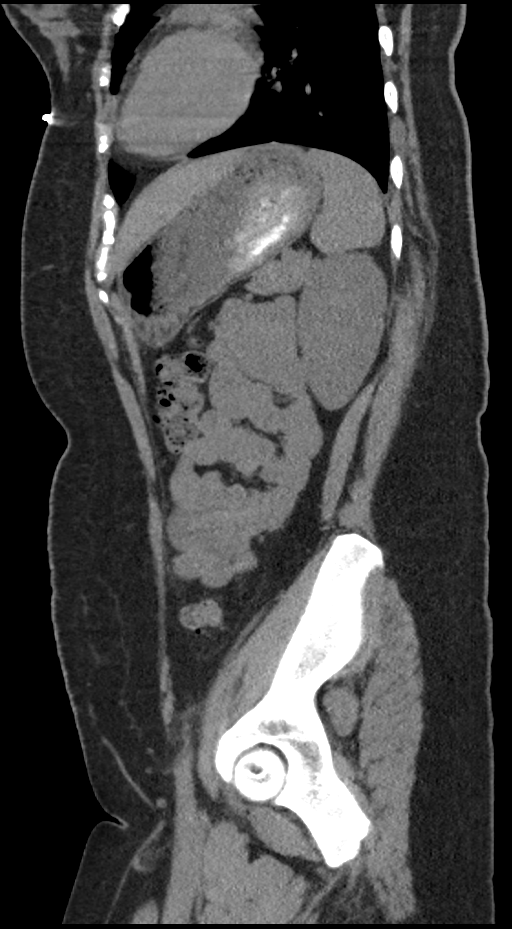
[im 70/210  bone]
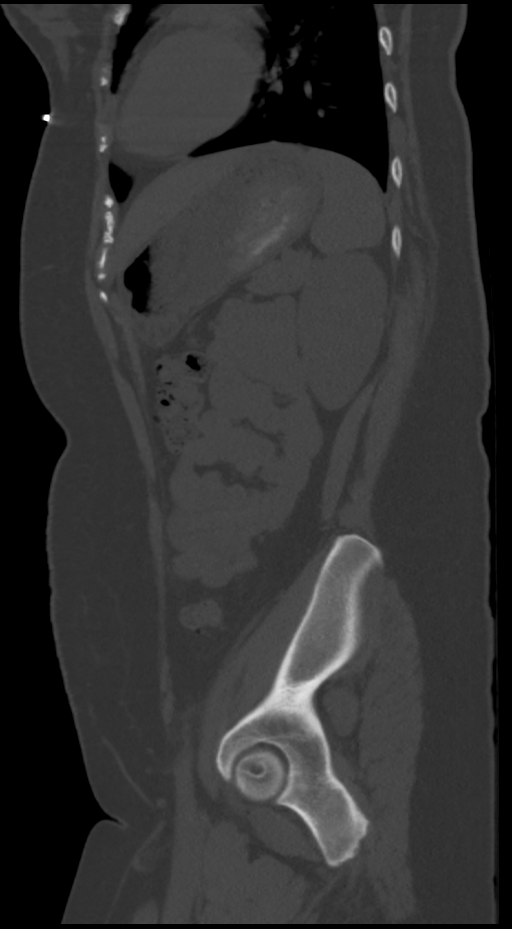

[9 of 46 positions shown; findings below may reference images not displayed]

FINDINGS: Lower chest: No acute abnormality.

Hepatobiliary: No focal liver abnormality. Tiny gallstone. No
gallbladder wall thickening or biliary dilatation.

Pancreas: Unremarkable. No pancreatic ductal dilatation or
surrounding inflammatory changes.

Spleen: Normal in size without focal abnormality.

Adrenals/Urinary Tract: Adrenal glands are unremarkable. Kidneys are
normal, without renal calculi, focal lesion, or hydronephrosis.
Bladder is decompressed.

Stomach/Bowel: Stomach is within normal limits. Appendix appears
normal. No evidence of bowel wall thickening, distention, or
inflammatory changes.

Vascular/Lymphatic: No significant vascular findings are present. No
enlarged abdominal or pelvic lymph nodes.

Reproductive: Uterus and bilateral adnexa are unremarkable. IUD
within the endometrial canal.

Other: Trace free fluid in the pelvis, likely physiologic. No
pneumoperitoneum.

Musculoskeletal: No acute or significant osseous findings.
IMPRESSION: 1.  No acute intra-abdominal process.
2. Cholelithiasis.

## 2020-03-12 ENCOUNTER — Encounter: Payer: Self-pay | Admitting: Advanced Practice Midwife

## 2020-03-12 ENCOUNTER — Other Ambulatory Visit: Payer: Self-pay

## 2020-03-12 ENCOUNTER — Ambulatory Visit (LOCAL_COMMUNITY_HEALTH_CENTER): Payer: Self-pay | Admitting: Advanced Practice Midwife

## 2020-03-12 VITALS — BP 117/69 | Ht 62.0 in | Wt 187.4 lb

## 2020-03-12 DIAGNOSIS — Z3042 Encounter for surveillance of injectable contraceptive: Secondary | ICD-10-CM

## 2020-03-12 DIAGNOSIS — Z3009 Encounter for other general counseling and advice on contraception: Secondary | ICD-10-CM

## 2020-03-12 DIAGNOSIS — Z30013 Encounter for initial prescription of injectable contraceptive: Secondary | ICD-10-CM

## 2020-03-12 NOTE — Progress Notes (Signed)
Patient here today for Depo. Last PE here was 07/31/2019, last Pap Smear was 05/16/2016 with next due 05/16/2021 per Epic care gaps info. Last Depo was 11/27/2019 (15.5 weeks.) Patient without complaints today and wants to continue with Depo. Depo given per 07/31/2019 order of Dr. Alvester Morin. Tawny Hopping, RN

## 2020-05-27 ENCOUNTER — Other Ambulatory Visit: Payer: Self-pay

## 2020-05-27 ENCOUNTER — Ambulatory Visit (LOCAL_COMMUNITY_HEALTH_CENTER): Payer: Self-pay

## 2020-05-27 VITALS — BP 117/69 | Ht 62.0 in | Wt 188.5 lb

## 2020-05-27 DIAGNOSIS — Z3009 Encounter for other general counseling and advice on contraception: Secondary | ICD-10-CM

## 2020-05-27 DIAGNOSIS — Z3042 Encounter for surveillance of injectable contraceptive: Secondary | ICD-10-CM

## 2020-05-27 DIAGNOSIS — Z30013 Encounter for initial prescription of injectable contraceptive: Secondary | ICD-10-CM

## 2020-05-27 DIAGNOSIS — Z01419 Encounter for gynecological examination (general) (routine) without abnormal findings: Secondary | ICD-10-CM

## 2020-05-27 NOTE — Progress Notes (Signed)
10 weeks 6 days post depo. Voices no concerns. Depo 150mg  IM given R UOQ per order by K.  , MD dated 07/31/2019. Tolerated well. Depo and RP due at next  Depo appt (approx 08/12/2020), pt aware. M. 08/14/2020, Interpreter. [place], RN

## 2020-10-28 ENCOUNTER — Ambulatory Visit: Payer: Self-pay

## 2020-11-04 ENCOUNTER — Other Ambulatory Visit: Payer: Self-pay

## 2020-11-04 ENCOUNTER — Ambulatory Visit: Payer: Self-pay

## 2020-11-08 ENCOUNTER — Ambulatory Visit (LOCAL_COMMUNITY_HEALTH_CENTER): Payer: Self-pay | Admitting: Family Medicine

## 2020-11-08 ENCOUNTER — Other Ambulatory Visit: Payer: Self-pay

## 2020-11-08 VITALS — BP 113/74 | Ht 61.5 in | Wt 190.8 lb

## 2020-11-08 DIAGNOSIS — Z3009 Encounter for other general counseling and advice on contraception: Secondary | ICD-10-CM

## 2020-11-08 DIAGNOSIS — Z3200 Encounter for pregnancy test, result unknown: Secondary | ICD-10-CM

## 2020-11-08 DIAGNOSIS — Z113 Encounter for screening for infections with a predominantly sexual mode of transmission: Secondary | ICD-10-CM

## 2020-11-08 DIAGNOSIS — Z30013 Encounter for initial prescription of injectable contraceptive: Secondary | ICD-10-CM

## 2020-11-08 DIAGNOSIS — Z0000 Encounter for general adult medical examination without abnormal findings: Secondary | ICD-10-CM

## 2020-11-08 LAB — WET PREP FOR TRICH, YEAST, CLUE
Trichomonas Exam: NEGATIVE
Yeast Exam: NEGATIVE

## 2020-11-08 LAB — PREGNANCY, URINE: Preg Test, Ur: NEGATIVE

## 2020-11-08 MED ORDER — MEDROXYPROGESTERONE ACETATE 150 MG/ML IM SUSP
150.0000 mg | INTRAMUSCULAR | Status: AC
  Administered 2020-11-08 – 2021-08-01 (×3): 150 mg via INTRAMUSCULAR

## 2020-11-08 NOTE — Progress Notes (Signed)
Family Planning Visit- Repeat Yearly Visit  Subjective:  Erica Rhodes is a 35 y.o. [id number]  being seen today for an annual wellness visit and to discuss contraception options.   The patient is currently using None for pregnancy prevention. Patient does not want a pregnancy in the next year. Patient has the following medical problems: has Overweight on their problem list.  Chief Complaint  Patient presents with   Contraception    Patient reports her for physical,  depo and STI testing   Patient denies and s/sx of infections   See flowsheet for other program required questions.   Body mass index is 35.47 kg/m. - Patient is eligible for diabetes screening based on BMI and age >42?  not applicable HA1C ordered? not applicable  Patient reports 1 of partners in last year. Desires STI screening?  Yes   Has patient been screened once for HCV in the past?  No  No results found for: HCVAB  Does the patient have current of drug use, have a partner with drug use, and/or has been incarcerated since last result? No  If yes-- Screen for HCV through [facility]   Does the patient meet criteria for HBV testing? No  Criteria:  -Household, sexual or needle sharing contact with HBV -History of drug use -HIV positive -Those with known Hep C   Health Maintenance Due  Topic Date Due   COVID-19 Vaccine (1) Never done   Hepatitis C Screening  Never done    Review of Systems  Constitutional:  Negative for chills, fever, malaise/fatigue and weight loss.  HENT:  Negative for congestion, hearing loss and sore throat.   Eyes:  Negative for blurred vision, double vision and photophobia.  Respiratory:  Negative for shortness of breath.   Cardiovascular:  Negative for chest pain.  Gastrointestinal:  Negative for abdominal pain, blood in stool, constipation, diarrhea, heartburn, nausea and vomiting.  Genitourinary:  Negative for dysuria and frequency.  Musculoskeletal:  Negative for  back pain, joint pain and neck pain.  Skin:  Negative for itching and rash.  Neurological:  Negative for dizziness, weakness and headaches.  Endo/Heme/Allergies:  Does not bruise/bleed easily.  Psychiatric/Behavioral:  Negative for depression, substance abuse and suicidal ideas.    The following portions of the patient's history were reviewed and updated as appropriate: allergies, current medications, past family history, past medical history, past social history, past surgical history and problem list. Problem list updated.  Objective:   Vitals:   11/08/20 1315  BP: 113/74  Weight: 190 lb 12.8 oz (86.5 kg)  Height: 5' 1.5" (1.562 m)    Physical Exam Vitals and nursing note reviewed.  Constitutional:      Appearance: Normal appearance.  HENT:     Head: Normocephalic and atraumatic.     Mouth/Throat:     Mouth: Mucous membranes are moist.     Pharynx: No oropharyngeal exudate or posterior oropharyngeal erythema.  Eyes:     General: No scleral icterus. Cardiovascular:     Rate and Rhythm: Normal rate and regular rhythm.     Pulses: Normal pulses.     Heart sounds: Normal heart sounds.  Pulmonary:     Effort: Pulmonary effort is normal.     Breath sounds: Normal breath sounds.  Chest:     Comments: Breasts:        Right: Normal. No swelling, mass, nipple discharge, skin change or tenderness.        Left: Normal. No swelling, mass, nipple  discharge, skin change or tenderness.   Abdominal:     General: Abdomen is flat. Bowel sounds are normal.     Palpations: Abdomen is soft.  Genitourinary:    Comments: Deferred - patient self collected specimens  Musculoskeletal:        General: Normal range of motion.     Cervical back: Normal range of motion and neck supple.  Skin:    General: Skin is warm and dry.  Neurological:     General: No focal deficit present.     Mental Status: She is alert and oriented to person, place, and time.  Psychiatric:        Behavior: Behavior  normal.      Assessment and Plan:  [name] is a 35 y.o. female [id number] presenting to the [facility] [place] [facility] for an yearly wellness and contraception visit    1. Routine general medical examination at a health care facility [facility] woman exam Pap not due until 05/16/2021 CBE completed today.    2. Family planning services Contraception counseling: Reviewed all forms of birth control options in the tiered based approach. available including abstinence; over the counter/barrier methods; hormonal contraceptive medication including pill, patch, ring, injection,contraceptive implant, ECP; hormonal and nonhormonal IUDs; permanent sterilization options including vasectomy and the various tubal sterilization modalities. Risks, benefits, and typical effectiveness rates were reviewed.  Questions were answered.  Written information was also given to the patient to review.  Patient desires Depo Prvera , this was prescribed for patient. She will follow up in  11-13 weeks  for surveillance.  She was told to call with any further questions, or with any concerns about this method of contraception.  Emphasized use of condoms 100% of the time for STI prevention.  Patient was not offered ECP based on when last sex was.   3. Screening examination for venereal disease  Pt desires screening, denies s/sx.  - Chlamydia/Gonorrhea [place] [facility] - HIV [employer] [facility] - Syphilis Serology, [place] [facility] - WET PREP FOR [place], YEAST, CLUE  4. Encounter for pregnancy test, result unknown LMP was 4 years ago.   - Pregnancy, urine  5. Encounter for initial prescription of injectable contraceptive RX Depo Provera 150 mg IM every 11-13 weeks x 1 yr.    - medroxyPROGESTERone (DEPO-PROVERA) injection 150 mg   Return in about 3 months (around 02/08/2021) for depo.  No future appointments.   Juliene Pina used for Spanish interpretation.      Wendi Snipes, FNP

## 2020-11-08 NOTE — Progress Notes (Addendum)
35 year old female in clinic today for a PE and depo restart. Patient is 23 wks post last depo.  Glenna Fellows, RN   Patient PT negative.  Wet mount reviewed, no treatment needed.  Next depo due 01/24/2021.  Depo given per 11/08/20 order Wendi Snipes, FNP. Glenna Fellows, RN

## 2020-11-10 LAB — HM HIV SCREENING LAB: HM HIV Screening: NEGATIVE

## 2020-11-11 ENCOUNTER — Telehealth: Payer: Self-pay

## 2020-11-12 ENCOUNTER — Ambulatory Visit: Payer: Self-pay

## 2020-11-12 ENCOUNTER — Other Ambulatory Visit: Payer: Self-pay

## 2020-11-12 DIAGNOSIS — A749 Chlamydial infection, unspecified: Secondary | ICD-10-CM

## 2020-11-12 MED ORDER — AZITHROMYCIN 500 MG PO TABS
1000.0000 mg | ORAL_TABLET | Freq: Once | ORAL | Status: AC
Start: 2020-11-12 — End: 2020-11-12
  Administered 2020-11-12: 1000 mg via ORAL

## 2020-11-12 NOTE — Telephone Encounter (Signed)
PC to pt with spanish interpreter [place]. Advised pt of positive chlamydia test results. Recommended treatment and counseled pt. Appt made for 7/1 at 830am. Pt verbalized understanding.

## 2020-11-12 NOTE — Telephone Encounter (Signed)
PC to pt with spanish interpreter

## 2020-11-12 NOTE — Progress Notes (Signed)
In nurse clinic for chlamydia treatment. Received first depo injection 11/08/2020. Treated today with Azithromycin 1 gm po DOT per SO Dr Ralene Bathe. Advised to eat after appt and to contact [facility] if vomits within 2 hr of taking med. Questions answered and reports understanding. M. [place], interpreter. Jerel Shepherd, RN

## 2021-01-24 ENCOUNTER — Other Ambulatory Visit: Payer: Self-pay

## 2021-01-24 ENCOUNTER — Ambulatory Visit (LOCAL_COMMUNITY_HEALTH_CENTER): Payer: Self-pay

## 2021-01-24 VITALS — BP 122/78 | Ht 61.5 in | Wt 188.0 lb

## 2021-01-24 DIAGNOSIS — Z3042 Encounter for surveillance of injectable contraceptive: Secondary | ICD-10-CM

## 2021-01-24 DIAGNOSIS — Z30013 Encounter for initial prescription of injectable contraceptive: Secondary | ICD-10-CM

## 2021-01-24 DIAGNOSIS — Z3009 Encounter for other general counseling and advice on contraception: Secondary | ICD-10-CM

## 2021-01-24 NOTE — Progress Notes (Signed)
DMPA 150 mg given IM (RUQ) today per 11/08/20 order by Wendi Snipes, NP and tolerated well.  Pateint is 11 weeks post last Depo. Hart Carwin, RN

## 2021-08-01 ENCOUNTER — Ambulatory Visit (LOCAL_COMMUNITY_HEALTH_CENTER): Payer: Self-pay | Admitting: Nurse Practitioner

## 2021-08-01 ENCOUNTER — Other Ambulatory Visit: Payer: Self-pay

## 2021-08-01 VITALS — BP 116/75 | Ht 62.6 in | Wt 178.0 lb

## 2021-08-01 DIAGNOSIS — Z309 Encounter for contraceptive management, unspecified: Secondary | ICD-10-CM

## 2021-08-01 DIAGNOSIS — Z3202 Encounter for pregnancy test, result negative: Secondary | ICD-10-CM

## 2021-08-01 DIAGNOSIS — Z3009 Encounter for other general counseling and advice on contraception: Secondary | ICD-10-CM

## 2021-08-01 DIAGNOSIS — Z3042 Encounter for surveillance of injectable contraceptive: Secondary | ICD-10-CM

## 2021-08-01 DIAGNOSIS — Z30013 Encounter for initial prescription of injectable contraceptive: Secondary | ICD-10-CM

## 2021-08-01 LAB — PREGNANCY, URINE: Preg Test, Ur: NEGATIVE

## 2021-08-01 NOTE — Progress Notes (Signed)
? ?[place] problem visit  ?[place] Department ? ?Subjective:  ?Erica Rhodes is a 36 y.o. being seen today for Patient in clinic today to resume Depo.  Last Depo was 01/24/2021 ([redacted]w[redacted]d).   ? ?Chief Complaint  ?Patient presents with  ? Contraception  ?  Depo injection  ? ? ?HPI ? ? ?Does the patient have a current or past history of drug use? No  ? No components found for: HCV] ? ? ?Health Maintenance Due  ?Topic Date Due  ? COVID-19 Vaccine (1) Never done  ? Hepatitis C Screening  Never done  ? INFLUENZA VACCINE  Never done  ? PAP SMEAR-Modifier  05/16/2021  ? ? ?Review of Systems  ?Constitutional:  Negative for chills, fever, malaise/fatigue and weight loss.  ?HENT:  Negative for congestion, hearing loss and sore throat.   ?Eyes:  Negative for blurred vision, double vision and photophobia.  ?Respiratory:  Negative for shortness of breath.   ?Cardiovascular:  Negative for chest pain.  ?Gastrointestinal:  Negative for abdominal pain, blood in stool, constipation, diarrhea, heartburn, nausea and vomiting.  ?Genitourinary:  Negative for dysuria and frequency.  ?Musculoskeletal:  Negative for back pain, joint pain and neck pain.  ?Skin:  Negative for itching and rash.  ?Neurological:  Negative for dizziness, weakness and headaches.  ?Endo/Heme/Allergies:  Does not bruise/bleed easily.  ?Psychiatric/Behavioral:  Negative for depression, substance abuse and suicidal ideas.   ? ?The following portions of the patient's history were reviewed and updated as appropriate: allergies, current medications, past family history, past medical history, past social history, past surgical history and problem list. Problem list updated. ? ? ?See flowsheet for other program required questions. ? ?Objective:  ? ?Vitals:  ? 08/01/21 1027  ?BP: 116/75  ?Weight: 178 lb (80.7 kg)  ?Height: 5' 2.6" (1.59 m)  ? ? ?Physical Exam ?Constitutional:   ?   Appearance: Normal appearance.  ?HENT:  ?   Head:  Normocephalic.  ?   Right Ear: External ear normal.  ?   Left Ear: External ear normal.  ?   Nose: Nose normal.  ?Pulmonary:  ?   Effort: Pulmonary effort is normal.  ?Musculoskeletal:  ?   Cervical back: Full passive range of motion without pain, normal range of motion and neck supple.  ?Skin: ?   General: Skin is warm and dry.  ?Neurological:  ?   Mental Status: She is alert and oriented to person, place, and time.  ?Psychiatric:     ?   Attention and Perception: Attention normal.     ?   Mood and Affect: Mood normal.     ?   Speech: Speech normal.     ?   Behavior: Behavior is cooperative.  ? ? ? ? ?Assessment and Plan:  ?Erica Rhodes is a 36 y.o. female presenting to the [facility] Department for a Women's Health problem visit ? ?1. Family planning counseling ?-Patient in clinic today to resume Depo.  Last Depo was 01/24/2021 ([redacted]w[redacted]d).  No problems not with Depo.  BP today within normal limits.  Last sex two weeks ago with a condom.  Patient sent to lab today for PT, if negative may have Depo today.   ? ?- Pregnancy, urine ? ?2. Surveillance for Depo-Provera contraception ?-PT negative, may have Depo today per 10/2020 order.  Stressed the importance of returning to clinic between 11-13 weeks for Depo injection.   ? ? ?Due to a language barrier  an interpreter [place] was used for the provider portion of the visit.    ? ?Return in about 11 weeks (around 10/17/2021). ? ? ?Gregary Cromer, FNP ? ?

## 2021-08-01 NOTE — Progress Notes (Signed)
Pt here for Depo.  She was notified of negative PT.   ?Depo 150 mg given IM in LUOQ without any complications.  Pt given reminder card to return in 11-13 weeks for next Depo injections.  Windle Guard, RN ? ?

## 2021-11-21 ENCOUNTER — Ambulatory Visit: Payer: Self-pay

## 2021-12-05 ENCOUNTER — Ambulatory Visit (LOCAL_COMMUNITY_HEALTH_CENTER): Payer: Self-pay | Admitting: Nurse Practitioner

## 2021-12-05 ENCOUNTER — Encounter: Payer: Self-pay | Admitting: Nurse Practitioner

## 2021-12-05 VITALS — BP 109/71 | Wt 187.8 lb

## 2021-12-05 DIAGNOSIS — Z3202 Encounter for pregnancy test, result negative: Secondary | ICD-10-CM

## 2021-12-05 DIAGNOSIS — Z3009 Encounter for other general counseling and advice on contraception: Secondary | ICD-10-CM

## 2021-12-05 DIAGNOSIS — Z01419 Encounter for gynecological examination (general) (routine) without abnormal findings: Secondary | ICD-10-CM

## 2021-12-05 DIAGNOSIS — Z3042 Encounter for surveillance of injectable contraceptive: Secondary | ICD-10-CM

## 2021-12-05 DIAGNOSIS — Z30013 Encounter for initial prescription of injectable contraceptive: Secondary | ICD-10-CM

## 2021-12-05 DIAGNOSIS — Z113 Encounter for screening for infections with a predominantly sexual mode of transmission: Secondary | ICD-10-CM

## 2021-12-05 LAB — WET PREP FOR TRICH, YEAST, CLUE
Trichomonas Exam: NEGATIVE
Yeast Exam: NEGATIVE

## 2021-12-05 LAB — HM HIV SCREENING LAB: HM HIV Screening: NEGATIVE

## 2021-12-05 LAB — PREGNANCY, URINE: Preg Test, Ur: NEGATIVE

## 2021-12-05 MED ORDER — MEDROXYPROGESTERONE ACETATE 150 MG/ML IM SUSP
150.0000 mg | INTRAMUSCULAR | Status: AC
  Administered 2021-12-05 – 2022-11-21 (×4): 150 mg via INTRAMUSCULAR

## 2021-12-05 NOTE — Progress Notes (Signed)
Patient here for PE, PAP, PT,STD testing, and depo shot. Condoms given. Wet prep reviewed, no tx per standing orders.

## 2021-12-05 NOTE — Progress Notes (Addendum)
[facility] [address] Main Number: [phone]    Family Planning Visit- Initial Visit  Subjective:  Erica Rhodes is a 36 y.o.  [id number]   being seen today for an initial annual visit and to discuss reproductive life planning.  The patient is currently using Hormonal Injection for pregnancy prevention. Patient reports   does not want a pregnancy in the next year.  Patient currently using Depo as a method.     report they are looking for a method that provides High efficacy at preventing pregnancy  Patient has the following medical conditions has Overweight on their problem list.  Chief Complaint  Patient presents with   Annual Exam    depo    Patient reports to clinic today for a physical, STD screening, and Depo   Body mass index is 33.69 kg/m. - Patient is eligible for diabetes screening based on BMI and age >49?  not applicable HA1C ordered? not applicable  Patient reports 1  partner/s in last year. Desires STI screening?  Yes  Has patient been screened once for HCV in the past?  No  No results found for: "HCVAB"  Does the patient have current drug use (including MJ), have a partner with drug use, and/or has been incarcerated since last result? No  If yes-- Screen for HCV through [facility]   Does the patient meet criteria for HBV testing? No  Criteria:  -Household, sexual or needle sharing contact with HBV -History of drug use -HIV positive -Those with known Hep C   Health Maintenance Due  Topic Date Due   COVID-19 Vaccine (1) Never done   Hepatitis C Screening  Never done   PAP SMEAR-Modifier  05/16/2021    Review of Systems  Constitutional:  Negative for chills, fever, malaise/fatigue and weight loss.  HENT:  Negative for congestion, hearing loss and sore throat.   Eyes:  Negative for blurred vision, double vision and photophobia.  Respiratory:  Negative for shortness of breath.    Cardiovascular:  Negative for chest pain.  Gastrointestinal:  Negative for abdominal pain, blood in stool, constipation, diarrhea, heartburn, nausea and vomiting.  Genitourinary:  Negative for dysuria and frequency.  Musculoskeletal:  Negative for back pain, joint pain and neck pain.  Skin:  Negative for itching and rash.  Neurological:  Negative for dizziness, weakness and headaches.  Endo/Heme/Allergies:  Does not bruise/bleed easily.  Psychiatric/Behavioral:  Negative for depression, substance abuse and suicidal ideas.     The following portions of the patient's history were reviewed and updated as appropriate: allergies, current medications, past family history, past medical history, past social history, past surgical history and problem list. Problem list updated.   See flowsheet for other program required questions.  Objective:   Vitals:   12/05/21 1335  BP: 109/71  Weight: 187 lb 12.8 oz (85.2 kg)    Physical Exam Constitutional:      Appearance: Normal appearance.  HENT:     Head: Normocephalic. No abrasion, masses or laceration. Hair is normal.     Jaw: No tenderness or swelling.     Right Ear: External ear normal.     Left Ear: External ear normal.     Nose: Nose normal.     Mouth/Throat:     Lips: Pink. No lesions.     Mouth: Mucous membranes are moist. No lacerations or oral lesions.     Dentition: No dental caries.  Tongue: No lesions.     Palate: No mass and lesions.     Pharynx: No pharyngeal swelling, oropharyngeal exudate, posterior oropharyngeal erythema or uvula swelling.     Tonsils: No tonsillar exudate or tonsillar abscesses.  Eyes:     Pupils: Pupils are equal, round, and reactive to light.  Neck:     Thyroid: No thyroid mass, thyromegaly or thyroid tenderness.  Cardiovascular:     Rate and Rhythm: Normal rate and regular rhythm.  Pulmonary:     Effort: Pulmonary effort is normal.     Breath sounds: Normal breath sounds.  Abdominal:      General: Abdomen is flat. Bowel sounds are normal.     Palpations: Abdomen is soft.     Tenderness: There is no abdominal tenderness. There is no rebound.  Genitourinary:    Pubic Area: No rash or pubic lice.      Labia:        Right: No rash, tenderness or lesion.        Left: No rash, tenderness or lesion.      Vagina: Normal. No vaginal discharge, erythema, tenderness or lesions.     Cervix: No cervical motion tenderness, discharge, lesion or erythema.     Uterus: Normal.      Adnexa:        Right: No tenderness.         Left: No tenderness.       Rectum: Normal.     Comments: Amount Discharge: small  Odor: No pH: less than 4.5 Adheres to vaginal wall: No Color:  color of discharge matches the Sharlize Hoar swab  Musculoskeletal:     Cervical back: Full passive range of motion without pain and normal range of motion.  Lymphadenopathy:     Cervical: No cervical adenopathy.     Right cervical: No superficial, deep or posterior cervical adenopathy.    Left cervical: No superficial, deep or posterior cervical adenopathy.     Upper Body:     Right upper body: No supraclavicular, axillary or epitrochlear adenopathy.     Left upper body: No supraclavicular, axillary or epitrochlear adenopathy.     Lower Body: No right inguinal adenopathy. No left inguinal adenopathy.  Skin:    General: Skin is warm and dry.     Findings: No erythema, laceration, lesion or rash.  Neurological:     Mental Status: She is alert and oriented to person, place, and time.  Psychiatric:        Attention and Perception: Attention normal.        Mood and Affect: Mood normal.        Speech: Speech normal.        Behavior: Behavior normal. Behavior is cooperative.       Assessment and Plan:  Erica Rhodes is a 36 y.o. female presenting to the [facility] Department for an initial annual wellness/contraceptive visit  Contraception counseling: Reviewed options based on patient desire and  reproductive life plan. Patient is interested in Hormonal Injection. This was provided to the patient today.   Risks, benefits, and typical effectiveness rates were reviewed.  Questions were answered.  Written information was also given to the patient to review.    The patient will follow up in  11 weeks for surveillance and Depo.  The patient was told to call with any further questions, or with any concerns about this method of contraception.  Emphasized use of condoms 100% of the time for STI  prevention.  Need for ECP was assessed. ECP not offered last sex 11/14/21.  1. Family planning counseling -36 year old female in clinic for a physical, STD screening, and Depo -ROS reviewed, no complaints noted. -Patient desires to continue with Depo ([redacted]w[redacted]d). PT negative today.   - Pregnancy, urine  2. Well woman exam with routine gynecological exam -Normal well woman exam. -CBE due 2024 -PAP performed today  - IGP, Aptima HPV  3. Screening examination for venereal disease -STD screening today. -Patient accepted all screenings including vaginal CT/GC, wet prep and bloodwork for HIV/RPR.  Patient meets criteria for HepB screening? No. Ordered? No - low risk  Patient meets criteria for HepC screening? No. Ordered? No - low risk   Treat wet prep per standing order Discussed time line for [facility] results and that patient will be called with positive results and encouraged patient to call if she had not heard in 2 weeks.  Counseled to return or seek care for continued or worsening symptoms Recommended condom use with all sex  Patient is currently using Hormonal Contraception: Injection, Rings and Patches to prevent pregnancy.    - WET PREP FOR TRICH, YEAST, CLUE - Chlamydia/Gonorrhea [place] [facility] - HIV [place] [facility] - Syphilis Serology,  Lab  4. Surveillance for Depo-Provera contraception -PT negative today, may have Depo 150 MG IM q 11-13 weeks x 1 year.   - medroxyPROGESTERone  (DEPO-PROVERA) injection 150 mg   Total time spent: 30 minutes   Return in about 11 weeks (around 02/20/2022) for Routine DMPA injection.    Glenna Fellows, FNP

## 2021-12-09 LAB — IGP, APTIMA HPV
HPV Aptima: NEGATIVE
PAP Smear Comment: 0

## 2022-02-20 ENCOUNTER — Ambulatory Visit: Payer: Self-pay

## 2022-04-04 ENCOUNTER — Encounter: Payer: Self-pay | Admitting: Nurse Practitioner

## 2022-04-04 ENCOUNTER — Ambulatory Visit (LOCAL_COMMUNITY_HEALTH_CENTER): Payer: Self-pay | Admitting: Nurse Practitioner

## 2022-04-04 VITALS — BP 115/76 | Ht 62.0 in | Wt 194.0 lb

## 2022-04-04 DIAGNOSIS — Z30013 Encounter for initial prescription of injectable contraceptive: Secondary | ICD-10-CM

## 2022-04-04 DIAGNOSIS — Z309 Encounter for contraceptive management, unspecified: Secondary | ICD-10-CM

## 2022-04-04 DIAGNOSIS — Z3042 Encounter for surveillance of injectable contraceptive: Secondary | ICD-10-CM

## 2022-04-04 NOTE — Progress Notes (Signed)
Patient in clinic today to receive Depo.  Last Depo given 12/05/21 ([redacted]w[redacted]d).  Stressed the importance of reporting to clinic during the recommended timeframe for Depo.  Also advised patient to take a PT within the next 2-3 weeks, last sex was 1 week ago.   Patient may have Depo per 12/05/21 order Glenna Fellows, FNP.   Glenna Fellows, FNP

## 2022-06-27 ENCOUNTER — Ambulatory Visit: Payer: Self-pay

## 2022-08-22 ENCOUNTER — Ambulatory Visit (LOCAL_COMMUNITY_HEALTH_CENTER): Payer: Self-pay | Admitting: Advanced Practice Midwife

## 2022-08-22 ENCOUNTER — Ambulatory Visit: Payer: Self-pay

## 2022-08-22 VITALS — BP 116/72 | Ht 64.0 in | Wt 199.4 lb

## 2022-08-22 DIAGNOSIS — Z3202 Encounter for pregnancy test, result negative: Secondary | ICD-10-CM

## 2022-08-22 DIAGNOSIS — Z30013 Encounter for initial prescription of injectable contraceptive: Secondary | ICD-10-CM

## 2022-08-22 DIAGNOSIS — Z309 Encounter for contraceptive management, unspecified: Secondary | ICD-10-CM

## 2022-08-22 DIAGNOSIS — Z3042 Encounter for surveillance of injectable contraceptive: Secondary | ICD-10-CM

## 2022-08-22 LAB — PREGNANCY, URINE: Preg Test, Ur: NEGATIVE

## 2022-08-22 NOTE — Progress Notes (Signed)
Pt here for Depo Provera injection.  Client is currently 19 weeks 6 days post last Depo injection.  Last sex is stated as "2 weeks ago" without condom.  Pregnancy test today is negative.  Depo Provera 150mg  IM given in LUOQ without complications.  Pt counseled to use condoms as backup x 7 days.  Condoms provided. Appointment reminder card given to pt.-Collins Scotland, RN

## 2022-11-07 ENCOUNTER — Ambulatory Visit: Payer: Self-pay

## 2022-11-21 ENCOUNTER — Ambulatory Visit (LOCAL_COMMUNITY_HEALTH_CENTER): Payer: Self-pay

## 2022-11-21 VITALS — BP 129/77 | Ht 64.0 in | Wt 201.5 lb

## 2022-11-21 DIAGNOSIS — Z3009 Encounter for other general counseling and advice on contraception: Secondary | ICD-10-CM

## 2022-11-21 DIAGNOSIS — Z3042 Encounter for surveillance of injectable contraceptive: Secondary | ICD-10-CM

## 2022-11-21 NOTE — Progress Notes (Signed)
13w 0d post depo. Voices no concerns. Depo given per order by A. White, FNP dated 12/05/2021. Tolerated well in RUOQ. RN explained that the patient's physical is due 12/07/2022; patient plans on scheduling physical and next depo, which is due 02/06/2023, together.   Language line, Lenda Kelp [phone], helped during this visit.   Abagail Kitchens, RN

## 2022-12-13 ENCOUNTER — Ambulatory Visit: Payer: Self-pay

## 2023-04-10 ENCOUNTER — Encounter: Payer: Self-pay | Admitting: Nurse Practitioner

## 2023-04-10 ENCOUNTER — Ambulatory Visit (LOCAL_COMMUNITY_HEALTH_CENTER): Payer: Self-pay | Admitting: Nurse Practitioner

## 2023-04-10 VITALS — BP 114/72 | HR 62 | Ht 64.0 in | Wt 193.4 lb

## 2023-04-10 DIAGNOSIS — Z3009 Encounter for other general counseling and advice on contraception: Secondary | ICD-10-CM

## 2023-04-10 DIAGNOSIS — Z3202 Encounter for pregnancy test, result negative: Secondary | ICD-10-CM

## 2023-04-10 DIAGNOSIS — E66811 Obesity, class 1: Secondary | ICD-10-CM

## 2023-04-10 DIAGNOSIS — Z113 Encounter for screening for infections with a predominantly sexual mode of transmission: Secondary | ICD-10-CM

## 2023-04-10 DIAGNOSIS — Z01419 Encounter for gynecological examination (general) (routine) without abnormal findings: Secondary | ICD-10-CM

## 2023-04-10 DIAGNOSIS — Z3042 Encounter for surveillance of injectable contraceptive: Secondary | ICD-10-CM

## 2023-04-10 LAB — HM HIV SCREENING LAB: HM HIV Screening: NEGATIVE

## 2023-04-10 LAB — WET PREP FOR TRICH, YEAST, CLUE
Trichomonas Exam: NEGATIVE
Yeast Exam: NEGATIVE

## 2023-04-10 LAB — PREGNANCY, URINE: Preg Test, Ur: NEGATIVE

## 2023-04-10 MED ORDER — MEDROXYPROGESTERONE ACETATE 150 MG/ML IM SUSP
150.0000 mg | INTRAMUSCULAR | Status: AC
  Administered 2023-04-10 – 2024-03-04 (×4): 150 mg via INTRAMUSCULAR

## 2023-04-10 NOTE — Progress Notes (Unsigned)
  Patient is here for [facility] visit. FP packet given to patient and contents reviewed. Wet prep results reviewed with pt, no treatment required per standing order. Depo injection given at LUOQ. Condoms declined. Sonda Primes, RN

## 2023-04-10 NOTE — Progress Notes (Signed)
[facility] [address] Main Number: [phone]  Family Planning Visit- Repeat Yearly Visit  Subjective:  Erica Rhodes is a 37 y.o. Erica Rhodes  being seen today for an annual wellness visit and to discuss contraception options.   The patient is currently using Hormonal Injection for pregnancy prevention. Patient does not want a pregnancy in the next year.   They report they are looking for a method that provides High efficacy at preventing pregnancy   Patient has the following medical problems: has Overweight on their problem list.  Chief Complaint  Patient presents with   Annual Exam    PE and depo    Patient reports one female partner, practices vaginal intercourse, condom use sometimes, no known history of STI, receiving hormonal injection for contraception.   See flowsheet for other program required questions.   Body mass index is 33.2 kg/m. - Patient is eligible for diabetes screening based on BMI> 25 and age >35?  yes HA1C ordered? No-Clinician oversight.   Patient reports 1 of partners in last year. Desires STI screening?  Yes   Has patient been screened once for HCV in the past?  No  No results found for: "HCVAB"  Does the patient have current of drug use, have a partner with drug use, and/or has been incarcerated since last result? No  If yes-- Screen for HCV through [facility]   Does the patient meet criteria for HBV testing? No  Criteria:  -Household, sexual or needle sharing contact with HBV -History of drug use -HIV positive -Those with known Hep C   Health Maintenance Due  Topic Date Due   Hepatitis C Screening  Never done   INFLUENZA VACCINE  Never done   COVID-19 Vaccine (1 - 2023-24 season) Never done    Review of Systems  Constitutional:  Positive for weight loss (Intentional 20-25lb weightloss over 3 months.).  All other systems reviewed and are negative.   The following  portions of the patient's history were reviewed and updated as appropriate: allergies, current medications, past family history, past medical history, past social history, past surgical history and problem list. Problem list updated.  Objective:   Vitals:   04/10/23 1027  BP: 114/72  Pulse: 62  Weight: 193 lb 6.4 oz (87.7 kg)  Height: 5\' 4"  (1.626 m)    Physical Exam Vitals and nursing note reviewed. Exam conducted with a chaperone present Erica Rhodes [place] (Spanish interpreter) present as chaperone during PE.).  Constitutional:      Appearance: Normal appearance. She is obese.  HENT:     Head: Normocephalic.     Salivary Glands: Right salivary gland is not diffusely enlarged or tender. Left salivary gland is not diffusely enlarged or tender.     Mouth/Throat:     Mouth: Mucous membranes are moist.     Pharynx: Oropharynx is clear. No oropharyngeal exudate.  Eyes:     General:        Right eye: No discharge.        Left eye: No discharge.  Neck:     Trachea: Trachea and phonation normal. No tracheal tenderness or tracheal deviation.  Cardiovascular:     Rate and Rhythm: Normal rate and regular rhythm.     Heart sounds: Normal heart sounds, S1 normal and S2 normal.  Pulmonary:     Effort: Pulmonary effort is normal.     Breath sounds: Normal breath sounds and air entry.  Chest:  Breasts:    Tanner Score is 5.     Breasts are symmetrical.     Right: Normal. No swelling, bleeding, inverted nipple, mass, nipple discharge, skin change or tenderness.     Left: Normal. No swelling, bleeding, inverted nipple, mass, nipple discharge, skin change or tenderness.  Abdominal:     General: Bowel sounds are decreased. There is no distension.     Palpations: Abdomen is soft.     Tenderness: There is no abdominal tenderness. There is no right CVA tenderness, left CVA tenderness, guarding or rebound.  Genitourinary:    General: Normal vulva.     Exam position: Lithotomy position.     Pubic  Area: No rash or pubic lice.      Tanner stage (genital): 5.     Labia:        Right: No rash, tenderness, lesion or injury.        Left: No rash, tenderness, lesion or injury.      Vagina: Normal. No vaginal discharge, erythema, tenderness, bleeding or lesions.     Cervix: Eversion present. No cervical motion tenderness, discharge, friability, lesion or cervical bleeding.     Uterus: Normal.      Adnexa: Right adnexa normal and left adnexa normal.     Comments: pH<4.5 Lymphadenopathy:     Head:     Right side of head: No submental, submandibular, tonsillar, preauricular or posterior auricular adenopathy.     Left side of head: No submental, submandibular, tonsillar, preauricular or posterior auricular adenopathy.     Cervical: No cervical adenopathy.     Right cervical: No superficial or posterior cervical adenopathy.    Left cervical: No superficial or posterior cervical adenopathy.     Upper Body:     Right upper body: No supraclavicular or axillary adenopathy.     Left upper body: No supraclavicular or axillary adenopathy.     Lower Body: No right inguinal adenopathy. No left inguinal adenopathy.  Skin:    General: Skin is warm and dry.          Comments: Skin tone appropriate for ethnicity.  Many tiny (too small to measure) round with regular borders skin colored raised bumps without discharge present across mid back. Patient endorsed pruritis when asked.   Neurological:     Mental Status: She is alert and oriented to person, place, and time.  Psychiatric:        Attention and Perception: Attention normal.        Mood and Affect: Mood and affect normal.        Speech: Speech normal.        Behavior: Behavior normal. Behavior is cooperative.        Thought Content: Thought content normal.    Assessment and Plan:  Erica Rhodes is a 37 y.o. female [phone] presenting to the [facility] Department for an yearly wellness and contraception visit  1. Encounter  for surveillance of injectable contraceptive - medroxyPROGESTERone (DEPO-PROVERA) injection 150 mg  2. Family planning UPT ordered in clinic today due to most recent menstrual cycle began >28 days ago and last Depo injcetion 20 weeks ago and late. UPT negative in office today. Depo given. - Pregnancy, urine Contraception counseling: Reviewed options based on patient desire and reproductive life plan. Patient is interested in Hormonal Injection. This was provided to the patient today.   Risks, benefits, and typical effectiveness rates were reviewed.  Questions were answered.  Written information was also given  to the patient to review.    The patient will follow up in  3 months for surveillance.  The patient was told to call with any further questions, or with any concerns about this method of contraception.  Emphasized use of condoms 100% of the time for STI prevention.  Educated on ECP and assessed need for ECP. Patient was NOT offered ECP based on no unprotected sex.   3. Well woman exam Completed CBE in office today. Next due in 3 years per ACOG guidelines.  PAP last performed 12/05/21: NILM, HPV (-). Next PAP in 5 years with co-testing.  PCP list given to patient. Patient advised to follow-up for annual complete physical with lab work that is unable to be performed at this clinic. Patient agreed to plan.  Patient encouraged to use a long handled sponge while showering for her back with a mild soap to relieve bumps in area. Bumps seem to be consistent with keratin buildup.    4. Screening for venereal disease Patient asymptomatic. Per pt request and clinic guidelines STI testing performed as follows.  - HIV [place] [facility] - Syphilis Serology, [place] [facility] - WET PREP FOR TRICH, YEAST, CLUE - Chlamydia/Gonorrhea [place] [facility]  5. Obesity (BMI 30.0-34.9) Patient endorses intentional 20-25lb weight loss over last 4 months. She reports maintaining a healthy diet with exercise. Patient  encouraged to continue with weight loss by healthy eating and exercise.   Due to language barrier, an interpreter Kenard Gower) was present during the history-taking and subsequent discussion (and for part of the physical exam) with this patient.  Return in about 3 months (around 07/11/2023) for Repeat hormonal injection.  No future appointments.  Edmonia James, NP

## 2023-04-11 NOTE — Progress Notes (Signed)

## 2023-04-23 ENCOUNTER — Telehealth: Payer: Self-pay

## 2023-04-23 NOTE — Telephone Encounter (Signed)
PW verified.  Pt notified of positive STD results.  Treatment appointment made for 04/24/23 @ 2:30.  Berdie Ogren, RN

## 2023-04-24 ENCOUNTER — Ambulatory Visit: Payer: Self-pay

## 2023-04-24 DIAGNOSIS — A749 Chlamydial infection, unspecified: Secondary | ICD-10-CM

## 2023-04-24 MED ORDER — DOXYCYCLINE HYCLATE 100 MG PO TABS: 100.0000 mg | ORAL_TABLET | Freq: Two times a day (BID) | ORAL | Status: AC

## 2023-04-24 NOTE — Progress Notes (Signed)
Pt is here for treatment of Chlamydia.   BCM:  Depo, last given 04/10/2023.  The patient was dispensed Doxycycline 100 mg #14 today. I provided counseling today regarding the medication. We discussed the medication, the side effects and when to call clinic. Patient given the opportunity to ask questions. Questions answered.  Condoms given.  Berdie Ogren, RN

## 2023-08-02 ENCOUNTER — Encounter: Payer: Self-pay | Admitting: Nurse Practitioner

## 2023-08-02 ENCOUNTER — Ambulatory Visit: Payer: Self-pay | Admitting: Nurse Practitioner

## 2023-08-02 VITALS — BP 119/76 | HR 60 | Ht 64.0 in | Wt 184.6 lb

## 2023-08-02 DIAGNOSIS — Z3202 Encounter for pregnancy test, result negative: Secondary | ICD-10-CM

## 2023-08-02 DIAGNOSIS — Z30013 Encounter for initial prescription of injectable contraceptive: Secondary | ICD-10-CM

## 2023-08-02 DIAGNOSIS — Z113 Encounter for screening for infections with a predominantly sexual mode of transmission: Secondary | ICD-10-CM

## 2023-08-02 DIAGNOSIS — Z3009 Encounter for other general counseling and advice on contraception: Secondary | ICD-10-CM

## 2023-08-02 DIAGNOSIS — Z309 Encounter for contraceptive management, unspecified: Secondary | ICD-10-CM

## 2023-08-02 LAB — PREGNANCY, URINE: Preg Test, Ur: NEGATIVE

## 2023-08-02 LAB — HM HIV SCREENING LAB: HM HIV Screening: NEGATIVE

## 2023-08-02 LAB — WET PREP FOR TRICH, YEAST, CLUE
Trichomonas Exam: NEGATIVE
Yeast Exam: NEGATIVE

## 2023-08-02 NOTE — Progress Notes (Signed)
 [employer] [facility] [address] [place] [address] Main phone: [phone]  Family Planning Visit - Repeat Yearly Visit  Subjective:  Erica Rhodes is a 38 y.o. [id number]  being seen today for symptomatic STI testing and to discuss contraception options. The patient is currently using hormonal injection for pregnancy prevention. Patient does not want a pregnancy in the next year.   Patient reports they are looking for a method with the following characteristics:  High efficacy at preventing pregnancy  Patient has the following medical problems:  Patient Active Problem List   Diagnosis Date Noted   Overweight 05/31/2017    Chief Complaint  Patient presents with   Contraception    depo    HPI Patient reports today for late depo injection and requests symptomatic STI screening. She states genital itching began 2 weeks ago and does not report any new soaps or detergents or partner. She has not tried any OTC methods for relief. She is also reporting dysuria that began at the same time. She states this is not intermittent and additionally feels she is going more frequently to urinate and is not emptying her bladder all the way. Patient denies frank blood in urine, fever, n/v, and back pain. The patient indicates 1 female partner in the last 2 months, some condom use, depo injection for contraception, vaginal sex, and no history of STI. She states last sex was 07/26/23. She reports no periods while on depo.   See flowsheet for other program required questions.   STI screening Patient reports 1 of partners in last 2 months. Does this patient desire STI screening?  Yes  Hepatitis C screening Has patient been screened once for HCV in the past?  No  No results found for: "HCVAB"  Does the patient meet criteria for HCV testing? No  (If yes-- Screen for HCV through [facility]) Criteria:  Since the last HCV result,  does the patient have any of the following? - Current drug use - Have a partner with drug use - Has been incarcerated  Hepatitis B screening Does the patient meet criteria for HBV testing? No Criteria:  -Household, sexual or needle sharing contact with HBV -History of drug use -HIV positive -Those with known Hep C  Cervical Cancer Screening  Result Date Procedure Results Follow-ups  12/05/2021 IGP, Aptima HPV DIAGNOSIS:: Comment Specimen adequacy:: Comment Clinician Provided ICD10: Comment Performed by:: Comment PAP Smear Comment: . Note:: Comment Test Methodology: Comment HPV Aptima: Negative     Health Maintenance Due  Topic Date Due   Hepatitis C Screening  Never done   INFLUENZA VACCINE  Never done   COVID-19 Vaccine (1 - 2024-25 season) Never done    The following portions of the patient's history were reviewed and updated as appropriate: allergies, current medications, past family history, past medical history, past social history, past surgical history and problem list. Problem list updated.  Objective:   Vitals:   08/02/23 0859  BP: 119/76  Pulse: 60  Weight: 184 lb 9.6 oz (83.7 kg)  Height: 5\' 4"  (1.626 m)    Physical Exam Vitals and nursing note reviewed. Exam conducted with a chaperone present Kenard Gower, [place] interpreter present as chaperone.).  Constitutional:      Appearance: Normal appearance.  HENT:     Head: Normocephalic.     Salivary Glands: Right salivary gland is not diffusely enlarged or tender. Left salivary gland is not diffusely enlarged or tender.  Mouth/Throat:     Lips: Pink. No lesions.     Mouth: Mucous membranes are moist.     Tongue: No lesions. Tongue does not deviate from midline.     Pharynx: Oropharynx is clear. Uvula midline.     Tonsils: No tonsillar exudate.  Eyes:     General:        Right eye: No discharge.        Left eye: No discharge.  Pulmonary:     Effort: Pulmonary effort is normal.   Genitourinary:    General: Normal vulva.     Exam position: Lithotomy position.     Pubic Area: No rash or pubic lice.      Tanner stage (genital): 5.     Labia:        Right: No rash, tenderness, lesion or injury.        Left: No rash, tenderness, lesion or injury.      Vagina: Normal. No vaginal discharge, erythema, tenderness, bleeding or lesions.     Cervix: Normal. Eversion present. No cervical motion tenderness, discharge, friability, lesion, erythema or cervical bleeding.     Uterus: Normal.      Adnexa: Right adnexa normal and left adnexa normal.     Comments: pH<4.5 Lymphadenopathy:     Head:     Right side of head: No submental, submandibular, tonsillar, preauricular or posterior auricular adenopathy.     Left side of head: No submental, submandibular, tonsillar, preauricular or posterior auricular adenopathy.     Cervical: No cervical adenopathy.     Right cervical: No superficial or posterior cervical adenopathy.    Left cervical: No superficial or posterior cervical adenopathy.     Upper Body:     Right upper body: No supraclavicular or axillary adenopathy.     Left upper body: No supraclavicular or axillary adenopathy.     Lower Body: No right inguinal adenopathy. No left inguinal adenopathy.  Skin:    General: Skin is warm and dry.     Findings: No rash.     Comments: Skin tone appropriate for ethnicity.   Neurological:     Mental Status: She is alert and oriented to person, place, and time.  Psychiatric:        Attention and Perception: Attention and perception normal.        Mood and Affect: Mood and affect normal.        Speech: Speech normal.        Behavior: Behavior normal. Behavior is cooperative.        Thought Content: Thought content normal.     Assessment and Plan:  Erica Rhodes is a 38 y.o. female [phone] presenting to the [facility] Department for an yearly wellness and contraception visit  Contraception counseling: Reviewed  options based on patient desire and reproductive life plan. Patient is interested in Hormonal Injection. This was provided to the patient today.  Risks, benefits, and typical effectiveness rates were reviewed.  Questions were answered.  Written information was also given to the patient to review.    The patient will follow up in  3 months for surveillance.  The patient was told to call with any further questions, or with any concerns about this method of contraception.  Emphasized use of condoms 100% of the time for STI prevention.  Educated on ECP and assessed need for ECP. Patient does not qualify for ECP based on no unprotected sex.    1. Screening for venereal  disease (Primary)  - Chlamydia/Gonorrhea [place] [facility] - [facility] - Syphilis Serology,  Lab - WET PREP FOR TRICH, YEAST, CLUE  2. Family planning  UPT negative in office today. Ok to give Depo today.   - Pregnancy, urine   Return in about 3 months (around 11/02/2023), or if symptoms worsen or fail to improve, for depo injection.  No future appointments.  Total time with patient   Edmonia James, NP minutes.

## 2023-08-02 NOTE — Progress Notes (Signed)
 Pt is here for depo injection and std screening.  Depo given in RUOQ per pt request, tolerated well and reminder card given. Wet prep results reviewed with pt, no treatment per standing order. Condoms given. Gaspar Garbe, RN

## 2023-11-20 ENCOUNTER — Ambulatory Visit: Payer: Self-pay

## 2023-11-20 VITALS — BP 125/61 | Ht 64.0 in | Wt 189.5 lb

## 2023-11-20 DIAGNOSIS — Z30013 Encounter for initial prescription of injectable contraceptive: Secondary | ICD-10-CM

## 2023-11-20 DIAGNOSIS — Z3009 Encounter for other general counseling and advice on contraception: Secondary | ICD-10-CM

## 2023-11-20 DIAGNOSIS — Z3042 Encounter for surveillance of injectable contraceptive: Secondary | ICD-10-CM

## 2023-11-20 NOTE — Progress Notes (Signed)
 15 weeks and 5 days post depo. Advised to adhere to 11-13 weeks between injections. Voices no concerns. Depo given today per order by JINNY Narrow, NP dated 04/10/2023. Tolerated well in LUOQ. Next depo due 02/05/2024; patient aware.  LL Interpreter, Jesus [phone], helped during this visit.   Doyce CINDERELLA Shuck, RN

## 2024-02-19 ENCOUNTER — Ambulatory Visit: Payer: Self-pay

## 2024-03-04 ENCOUNTER — Ambulatory Visit: Payer: Self-pay

## 2024-03-04 VITALS — BP 119/70 | HR 66 | Ht 64.0 in | Wt 194.0 lb

## 2024-03-04 DIAGNOSIS — Z30013 Encounter for initial prescription of injectable contraceptive: Secondary | ICD-10-CM

## 2024-03-04 DIAGNOSIS — Z3009 Encounter for other general counseling and advice on contraception: Secondary | ICD-10-CM

## 2024-03-04 DIAGNOSIS — Z3042 Encounter for surveillance of injectable contraceptive: Secondary | ICD-10-CM

## 2024-03-04 NOTE — Progress Notes (Addendum)
 Presents for Depo at 16 weeks post last Depo. Counseled that Depo most effective when administered every 11 - 13 weeks and risk for breakthrough bleeding decreased. Counseled that may experience some intermittent vaginal bleeding since Depo administered late. Client tolerated Depo injection without complaint. Counseled that physical needed prior to next Depo injection or may schedule physical when Depo is due. Confirmed with client that she understood. MVI counseling completed. Depo / physical due date reminder card given to client. Counseled to check out with folder / encounter at window # 8. Burnadette Lowers, RN
